# Patient Record
Sex: Male | Born: 1950 | Race: White | Hispanic: No | Marital: Married | Smoking: Never smoker
Health system: Southern US, Community
[De-identification: ages and names within clinical notes are randomized; demographics above are authoritative.]

## PROBLEM LIST (undated history)

## (undated) DIAGNOSIS — H60339 Swimmer's ear, unspecified ear: Secondary | ICD-10-CM

## (undated) DIAGNOSIS — E785 Hyperlipidemia, unspecified: Secondary | ICD-10-CM

## (undated) DIAGNOSIS — K900 Celiac disease: Secondary | ICD-10-CM

## (undated) DIAGNOSIS — K219 Gastro-esophageal reflux disease without esophagitis: Secondary | ICD-10-CM

## (undated) DIAGNOSIS — M545 Low back pain, unspecified: Secondary | ICD-10-CM

## (undated) DIAGNOSIS — I2510 Atherosclerotic heart disease of native coronary artery without angina pectoris: Secondary | ICD-10-CM

## (undated) HISTORY — DX: Swimmer's ear, unspecified ear: H60.339

## (undated) HISTORY — DX: Celiac disease: K90.0

## (undated) HISTORY — DX: Low back pain, unspecified: M54.50

## (undated) HISTORY — DX: Atherosclerotic heart disease of native coronary artery without angina pectoris: I25.10

## (undated) HISTORY — PX: UPPER GASTROINTESTINAL ENDOSCOPY: SHX188

## (undated) HISTORY — DX: Hyperlipidemia, unspecified: E78.5

## (undated) HISTORY — DX: Gastro-esophageal reflux disease without esophagitis: K21.9

## (undated) HISTORY — PX: COLONOSCOPY: SHX174

## (undated) HISTORY — DX: Low back pain: M54.5

---
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
## 1971-02-11 HISTORY — PX: APPENDECTOMY: SHX54

## 1983-02-11 HISTORY — PX: NASAL SEPTOPLASTY W/ TURBINOPLASTY: SHX2070

## 2004-01-16 ENCOUNTER — Ambulatory Visit: Payer: Self-pay | Admitting: Internal Medicine

## 2004-01-23 ENCOUNTER — Ambulatory Visit: Payer: Self-pay | Admitting: Internal Medicine

## 2004-02-29 ENCOUNTER — Ambulatory Visit: Payer: Self-pay | Admitting: Internal Medicine

## 2004-03-05 ENCOUNTER — Ambulatory Visit: Payer: Self-pay | Admitting: Gastroenterology

## 2004-03-07 ENCOUNTER — Ambulatory Visit: Payer: Self-pay | Admitting: Internal Medicine

## 2004-03-19 ENCOUNTER — Ambulatory Visit: Payer: Self-pay | Admitting: Gastroenterology

## 2004-08-30 ENCOUNTER — Ambulatory Visit: Payer: Self-pay | Admitting: Internal Medicine

## 2004-09-06 ENCOUNTER — Ambulatory Visit: Payer: Self-pay | Admitting: Internal Medicine

## 2005-03-03 ENCOUNTER — Ambulatory Visit: Payer: Self-pay | Admitting: Internal Medicine

## 2005-03-07 ENCOUNTER — Ambulatory Visit: Payer: Self-pay | Admitting: Internal Medicine

## 2006-12-24 DIAGNOSIS — G4700 Insomnia, unspecified: Secondary | ICD-10-CM

## 2006-12-24 DIAGNOSIS — K219 Gastro-esophageal reflux disease without esophagitis: Secondary | ICD-10-CM

## 2006-12-24 DIAGNOSIS — E785 Hyperlipidemia, unspecified: Secondary | ICD-10-CM

## 2007-01-05 ENCOUNTER — Telehealth: Payer: Self-pay | Admitting: Internal Medicine

## 2007-02-10 ENCOUNTER — Ambulatory Visit: Payer: Self-pay | Admitting: Internal Medicine

## 2007-02-10 LAB — CONVERTED CEMR LAB
ALT: 28 units/L (ref 0–53)
AST: 24 units/L (ref 0–37)
Albumin: 4.2 g/dL (ref 3.5–5.2)
Alkaline Phosphatase: 63 units/L (ref 39–117)
BUN: 15 mg/dL (ref 6–23)
Basophils Absolute: 0 10*3/uL (ref 0.0–0.1)
Basophils Relative: 0.3 % (ref 0.0–1.0)
Bilirubin Urine: NEGATIVE
Bilirubin, Direct: 0.2 mg/dL (ref 0.0–0.3)
Blood in Urine, dipstick: NEGATIVE
CO2: 30 meq/L (ref 19–32)
Calcium: 9.4 mg/dL (ref 8.4–10.5)
Chloride: 104 meq/L (ref 96–112)
Cholesterol: 186 mg/dL (ref 0–200)
Creatinine, Ser: 1 mg/dL (ref 0.4–1.5)
Eosinophils Absolute: 0.2 10*3/uL (ref 0.0–0.6)
Eosinophils Relative: 3.1 % (ref 0.0–5.0)
GFR calc Af Amer: 99 mL/min
GFR calc non Af Amer: 82 mL/min
Glucose, Bld: 93 mg/dL (ref 70–99)
Glucose, Urine, Semiquant: NEGATIVE
HCT: 43.3 % (ref 39.0–52.0)
HDL: 36.7 mg/dL — ABNORMAL LOW (ref >39.0)
Hemoglobin: 15.2 g/dL (ref 13.0–17.0)
Ketones, urine, test strip: NEGATIVE
LDL Cholesterol: 113 mg/dL — ABNORMAL HIGH (ref 0–99)
Lymphocytes Relative: 25 % (ref 12.0–46.0)
MCHC: 35.1 g/dL (ref 30.0–36.0)
MCV: 87 fL (ref 78.0–100.0)
Monocytes Absolute: 0.5 10*3/uL (ref 0.2–0.7)
Monocytes Relative: 7.5 % (ref 3.0–11.0)
Neutro Abs: 4.3 10*3/uL (ref 1.4–7.7)
Neutrophils Relative %: 64.1 % (ref 43.0–77.0)
Nitrite: NEGATIVE
PSA: 1.89 ng/mL (ref 0.10–4.00)
Platelets: 226 10*3/uL (ref 150–400)
Potassium: 4.4 meq/L (ref 3.5–5.1)
Protein, U semiquant: NEGATIVE
RBC: 4.98 M/uL (ref 4.22–5.81)
RDW: 13.3 % (ref 11.5–14.6)
Sodium: 141 meq/L (ref 135–145)
Specific Gravity, Urine: >=1.03
TSH: 3.91 microintl units/mL (ref 0.35–5.50)
Total Bilirubin: 0.9 mg/dL (ref 0.3–1.2)
Total CHOL/HDL Ratio: 5.1
Total Protein: 7.1 g/dL (ref 6.0–8.3)
Triglycerides: 182 mg/dL — ABNORMAL HIGH (ref 0–149)
Urobilinogen, UA: 0.2
VLDL: 36 mg/dL (ref 0–40)
WBC Urine, dipstick: NEGATIVE
WBC: 6.7 10*3/uL (ref 4.5–10.5)
pH: 5.5

## 2007-02-11 HISTORY — PX: CORONARY ARTERY BYPASS GRAFT: SHX141

## 2007-02-19 ENCOUNTER — Ambulatory Visit: Payer: Self-pay | Admitting: Internal Medicine

## 2007-02-22 ENCOUNTER — Telehealth: Payer: Self-pay | Admitting: Internal Medicine

## 2007-02-26 ENCOUNTER — Telehealth (INDEPENDENT_AMBULATORY_CARE_PROVIDER_SITE_OTHER): Payer: Self-pay | Admitting: *Deleted

## 2007-03-03 ENCOUNTER — Ambulatory Visit: Payer: Self-pay | Admitting: Internal Medicine

## 2007-03-08 ENCOUNTER — Telehealth (INDEPENDENT_AMBULATORY_CARE_PROVIDER_SITE_OTHER): Payer: Self-pay | Admitting: *Deleted

## 2007-03-09 ENCOUNTER — Telehealth (INDEPENDENT_AMBULATORY_CARE_PROVIDER_SITE_OTHER): Payer: Self-pay | Admitting: *Deleted

## 2007-03-10 ENCOUNTER — Ambulatory Visit: Payer: Self-pay | Admitting: Cardiology

## 2007-03-10 ENCOUNTER — Inpatient Hospital Stay (HOSPITAL_COMMUNITY): Admission: AD | Admit: 2007-03-10 | Discharge: 2007-03-16 | Payer: Self-pay | Admitting: Cardiology

## 2007-03-11 ENCOUNTER — Telehealth: Payer: Self-pay | Admitting: Internal Medicine

## 2007-03-11 ENCOUNTER — Encounter: Payer: Self-pay | Admitting: Thoracic Surgery (Cardiothoracic Vascular Surgery)

## 2007-03-11 ENCOUNTER — Ambulatory Visit: Payer: Self-pay | Admitting: Thoracic Surgery (Cardiothoracic Vascular Surgery)

## 2007-04-05 ENCOUNTER — Ambulatory Visit: Payer: Self-pay | Admitting: Thoracic Surgery (Cardiothoracic Vascular Surgery)

## 2007-04-05 ENCOUNTER — Ambulatory Visit: Payer: Self-pay | Admitting: Cardiology

## 2007-04-05 ENCOUNTER — Encounter
Admission: RE | Admit: 2007-04-05 | Discharge: 2007-04-05 | Payer: Self-pay | Admitting: Thoracic Surgery (Cardiothoracic Vascular Surgery)

## 2007-04-05 ENCOUNTER — Encounter: Payer: Self-pay | Admitting: Internal Medicine

## 2007-04-08 ENCOUNTER — Encounter (HOSPITAL_COMMUNITY): Admission: RE | Admit: 2007-04-08 | Discharge: 2007-07-07 | Payer: Self-pay | Admitting: Cardiology

## 2007-05-05 ENCOUNTER — Encounter
Admission: RE | Admit: 2007-05-05 | Discharge: 2007-05-05 | Payer: Self-pay | Admitting: Thoracic Surgery (Cardiothoracic Vascular Surgery)

## 2007-05-13 ENCOUNTER — Ambulatory Visit: Payer: Self-pay | Admitting: Cardiology

## 2007-05-13 LAB — CONVERTED CEMR LAB
BUN: 15 mg/dL (ref 6–23)
Calcium: 9.1 mg/dL (ref 8.4–10.5)
Creatinine, Ser: 1 mg/dL (ref 0.4–1.5)
GFR calc Af Amer: 99 mL/min
GFR calc non Af Amer: 82 mL/min
Potassium: 4.2 meq/L (ref 3.5–5.1)

## 2007-06-25 ENCOUNTER — Ambulatory Visit: Payer: Self-pay | Admitting: Cardiology

## 2007-07-02 ENCOUNTER — Ambulatory Visit: Payer: Self-pay | Admitting: Cardiology

## 2007-07-02 LAB — CONVERTED CEMR LAB
ALT: 25 units/L (ref 0–53)
AST: 27 units/L (ref 0–37)
Alkaline Phosphatase: 82 units/L (ref 39–117)
Bilirubin, Direct: 0.1 mg/dL (ref 0.0–0.3)
CO2: 23 meq/L (ref 19–32)
Chloride: 107 meq/L (ref 96–112)
Glucose, Bld: 87 mg/dL (ref 70–99)
HDL: 31.8 mg/dL — ABNORMAL LOW (ref >39.0)
Potassium: 4.5 meq/L (ref 3.5–5.1)
Sodium: 137 meq/L (ref 135–145)
Total Protein: 7.7 g/dL (ref 6.0–8.3)

## 2007-09-16 ENCOUNTER — Ambulatory Visit: Payer: Self-pay | Admitting: Cardiology

## 2007-09-16 LAB — CONVERTED CEMR LAB
AST: 25 units/L (ref 0–37)
Alkaline Phosphatase: 51 units/L (ref 39–117)
LDL Cholesterol: 56 mg/dL (ref 0–99)
Total Bilirubin: 0.8 mg/dL (ref 0.3–1.2)
Total CHOL/HDL Ratio: 3.9

## 2007-10-14 ENCOUNTER — Ambulatory Visit: Payer: Self-pay | Admitting: Internal Medicine

## 2008-01-13 ENCOUNTER — Ambulatory Visit: Payer: Self-pay | Admitting: Cardiology

## 2008-01-13 LAB — CONVERTED CEMR LAB
Bilirubin, Direct: 0.2 mg/dL (ref 0.0–0.3)
HDL: 35 mg/dL — ABNORMAL LOW (ref >39.0)
LDL Cholesterol: 33 mg/dL (ref 0–99)
Total Bilirubin: 0.9 mg/dL (ref 0.3–1.2)
Total Protein: 6.9 g/dL (ref 6.0–8.3)
VLDL: 8 mg/dL (ref 0–40)

## 2008-01-20 ENCOUNTER — Ambulatory Visit: Payer: Self-pay | Admitting: Cardiology

## 2008-01-20 DIAGNOSIS — I25810 Atherosclerosis of coronary artery bypass graft(s) without angina pectoris: Secondary | ICD-10-CM

## 2008-03-27 ENCOUNTER — Encounter (INDEPENDENT_AMBULATORY_CARE_PROVIDER_SITE_OTHER): Payer: Self-pay | Admitting: *Deleted

## 2008-03-27 ENCOUNTER — Ambulatory Visit: Payer: Self-pay | Admitting: Internal Medicine

## 2008-05-22 ENCOUNTER — Ambulatory Visit: Payer: Self-pay | Admitting: Cardiology

## 2008-05-22 LAB — CONVERTED CEMR LAB
AST: 34 units/L (ref 0–37)
Albumin: 4 g/dL (ref 3.5–5.2)
Alkaline Phosphatase: 55 units/L (ref 39–117)
Cholesterol: 90 mg/dL (ref 0–200)
HDL: 41.6 mg/dL (ref >39.00)
LDL Cholesterol: 40 mg/dL (ref 0–99)
Total CHOL/HDL Ratio: 2
Total Protein: 7.2 g/dL (ref 6.0–8.3)
Triglycerides: 41 mg/dL (ref 0.0–149.0)

## 2008-06-19 ENCOUNTER — Ambulatory Visit: Payer: Self-pay | Admitting: Cardiovascular Disease

## 2008-09-21 ENCOUNTER — Ambulatory Visit: Payer: Self-pay | Admitting: Cardiology

## 2008-09-22 LAB — CONVERTED CEMR LAB
AST: 27 units/L (ref 0–37)
Albumin: 3.9 g/dL (ref 3.5–5.2)
Total Bilirubin: 1 mg/dL (ref 0.3–1.2)

## 2008-10-04 ENCOUNTER — Encounter: Payer: Self-pay | Admitting: Internal Medicine

## 2008-10-05 ENCOUNTER — Ambulatory Visit: Payer: Self-pay | Admitting: Cardiovascular Disease

## 2008-10-05 ENCOUNTER — Telehealth: Payer: Self-pay | Admitting: Internal Medicine

## 2008-10-05 ENCOUNTER — Telehealth (INDEPENDENT_AMBULATORY_CARE_PROVIDER_SITE_OTHER): Payer: Self-pay | Admitting: Pharmacist

## 2008-11-06 ENCOUNTER — Encounter (INDEPENDENT_AMBULATORY_CARE_PROVIDER_SITE_OTHER): Payer: Self-pay | Admitting: *Deleted

## 2009-02-16 ENCOUNTER — Telehealth: Payer: Self-pay | Admitting: Cardiology

## 2009-02-20 ENCOUNTER — Encounter (INDEPENDENT_AMBULATORY_CARE_PROVIDER_SITE_OTHER): Payer: Self-pay | Admitting: *Deleted

## 2009-02-23 ENCOUNTER — Ambulatory Visit: Payer: Self-pay | Admitting: Internal Medicine

## 2009-02-27 ENCOUNTER — Ambulatory Visit: Payer: Self-pay | Admitting: Cardiology

## 2009-02-28 ENCOUNTER — Encounter (INDEPENDENT_AMBULATORY_CARE_PROVIDER_SITE_OTHER): Payer: Self-pay | Admitting: *Deleted

## 2009-02-28 LAB — CONVERTED CEMR LAB
ALT: 26 U/L
AST: 33 U/L
Albumin: 3.9 g/dL
Alkaline Phosphatase: 47 U/L
Bilirubin, Direct: 0.2 mg/dL
Cholesterol: 95 mg/dL
HDL: 48.3 mg/dL
LDL Cholesterol: 39 mg/dL
Total Bilirubin: 1 mg/dL
Total CHOL/HDL Ratio: 2
Total Protein: 6.6 g/dL
Triglycerides: 38 mg/dL
VLDL: 7.6 mg/dL

## 2009-03-05 ENCOUNTER — Ambulatory Visit: Payer: Self-pay | Admitting: Cardiology

## 2009-03-09 ENCOUNTER — Ambulatory Visit: Payer: Self-pay | Admitting: Internal Medicine

## 2009-03-15 ENCOUNTER — Encounter: Payer: Self-pay | Admitting: Internal Medicine

## 2009-04-11 ENCOUNTER — Ambulatory Visit: Payer: Self-pay | Admitting: Internal Medicine

## 2009-04-11 DIAGNOSIS — J069 Acute upper respiratory infection, unspecified: Secondary | ICD-10-CM | POA: Insufficient documentation

## 2009-07-24 ENCOUNTER — Ambulatory Visit: Payer: Self-pay | Admitting: Family Medicine

## 2009-07-24 DIAGNOSIS — H60339 Swimmer's ear, unspecified ear: Secondary | ICD-10-CM

## 2009-08-02 ENCOUNTER — Telehealth: Payer: Self-pay | Admitting: Family Medicine

## 2009-08-02 ENCOUNTER — Ambulatory Visit: Payer: Self-pay | Admitting: Family Medicine

## 2009-09-27 IMAGING — CR DG CHEST 1V PORT
1 series · 1 of 1 positions shown · non-contrast
Comparison: 03/10/07.

CLINICAL DATA: Post catheter.  
 PORTABLE CHEST ? 1 VIEW:

[AP]
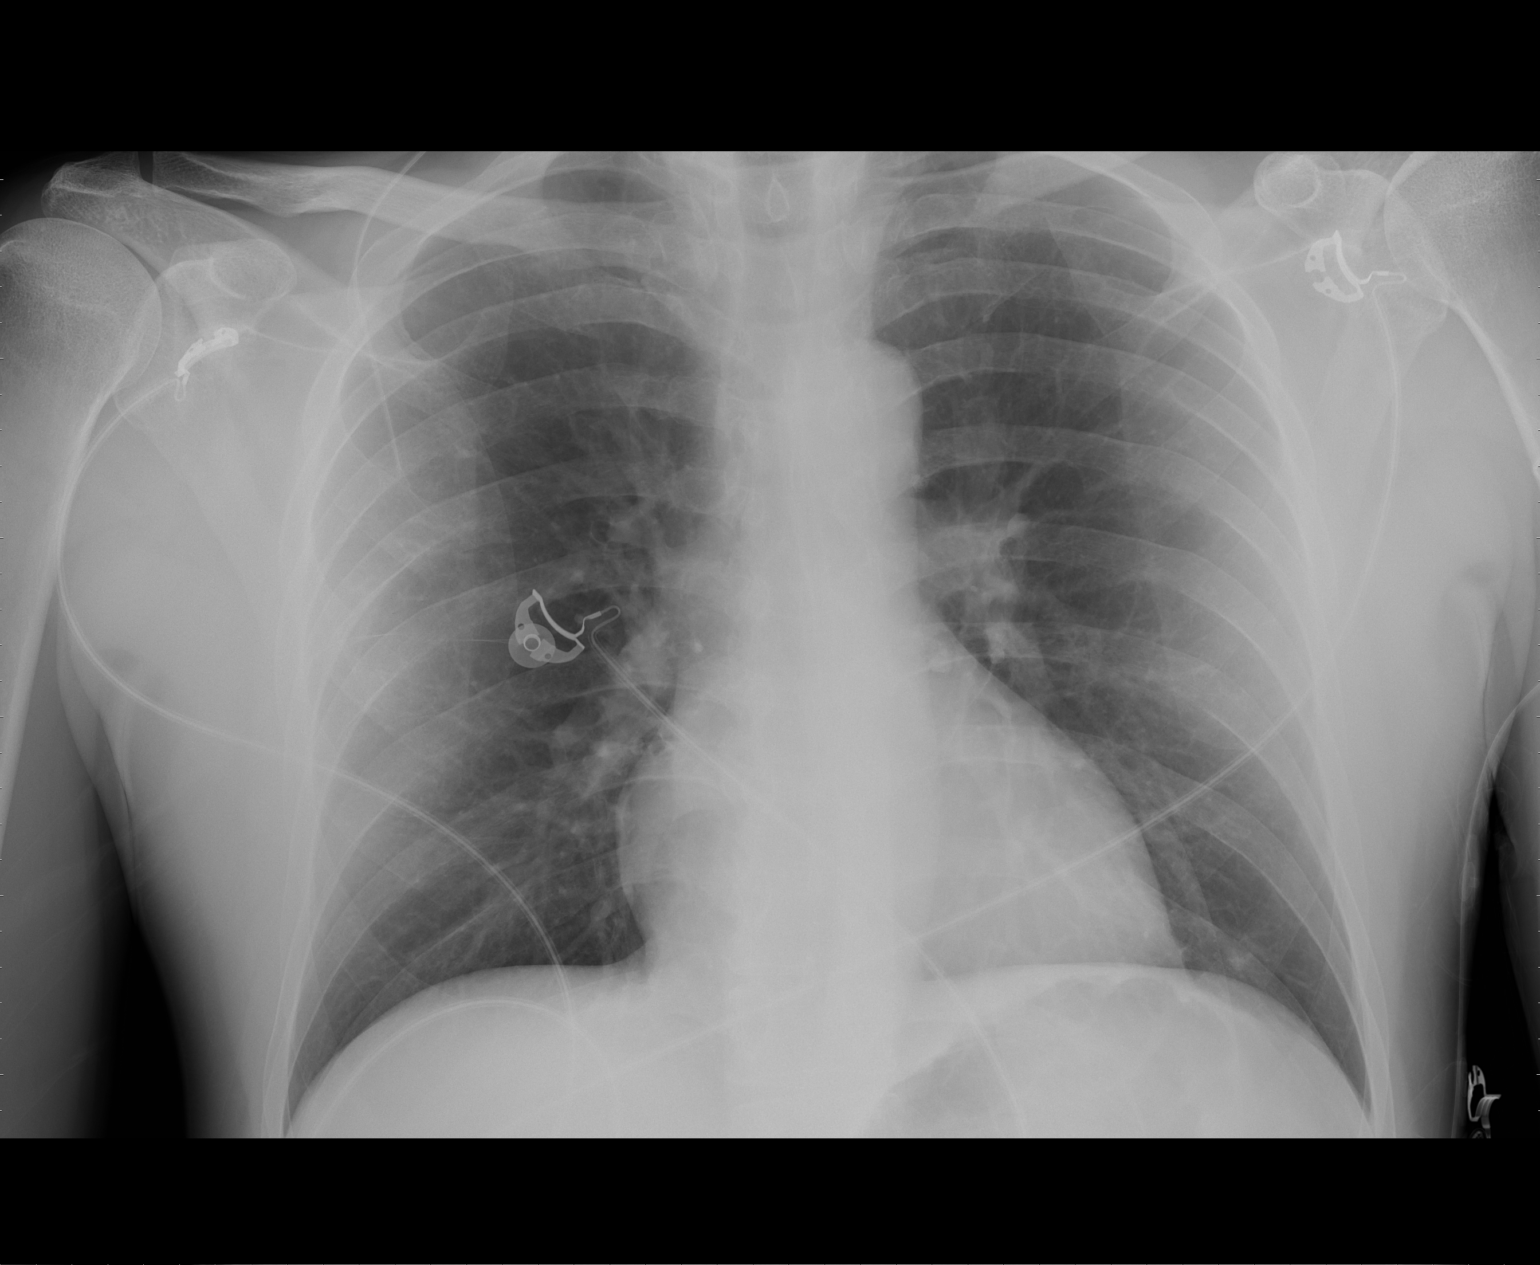

[1 of 1 positions shown; findings below may reference images not displayed]

FINDINGS: AP portable film at 8943 hours demonstrates cardiomegaly with clear lung fields.  No effusion, pneumothorax, or atelectasis and no change from prior.  Mild periapical bullous disease is suggested, possibility representing COPD.
IMPRESSION: No acute findings and no change from prior.

## 2009-10-22 IMAGING — CR DG CHEST 2V
2 series · 2 of 2 positions shown · non-contrast
Comparison: 03/15/07.

CLINICAL DATA: Status post CABG, follow up evaluation. 
TWO VIEW CHEST:

[w chest pa]
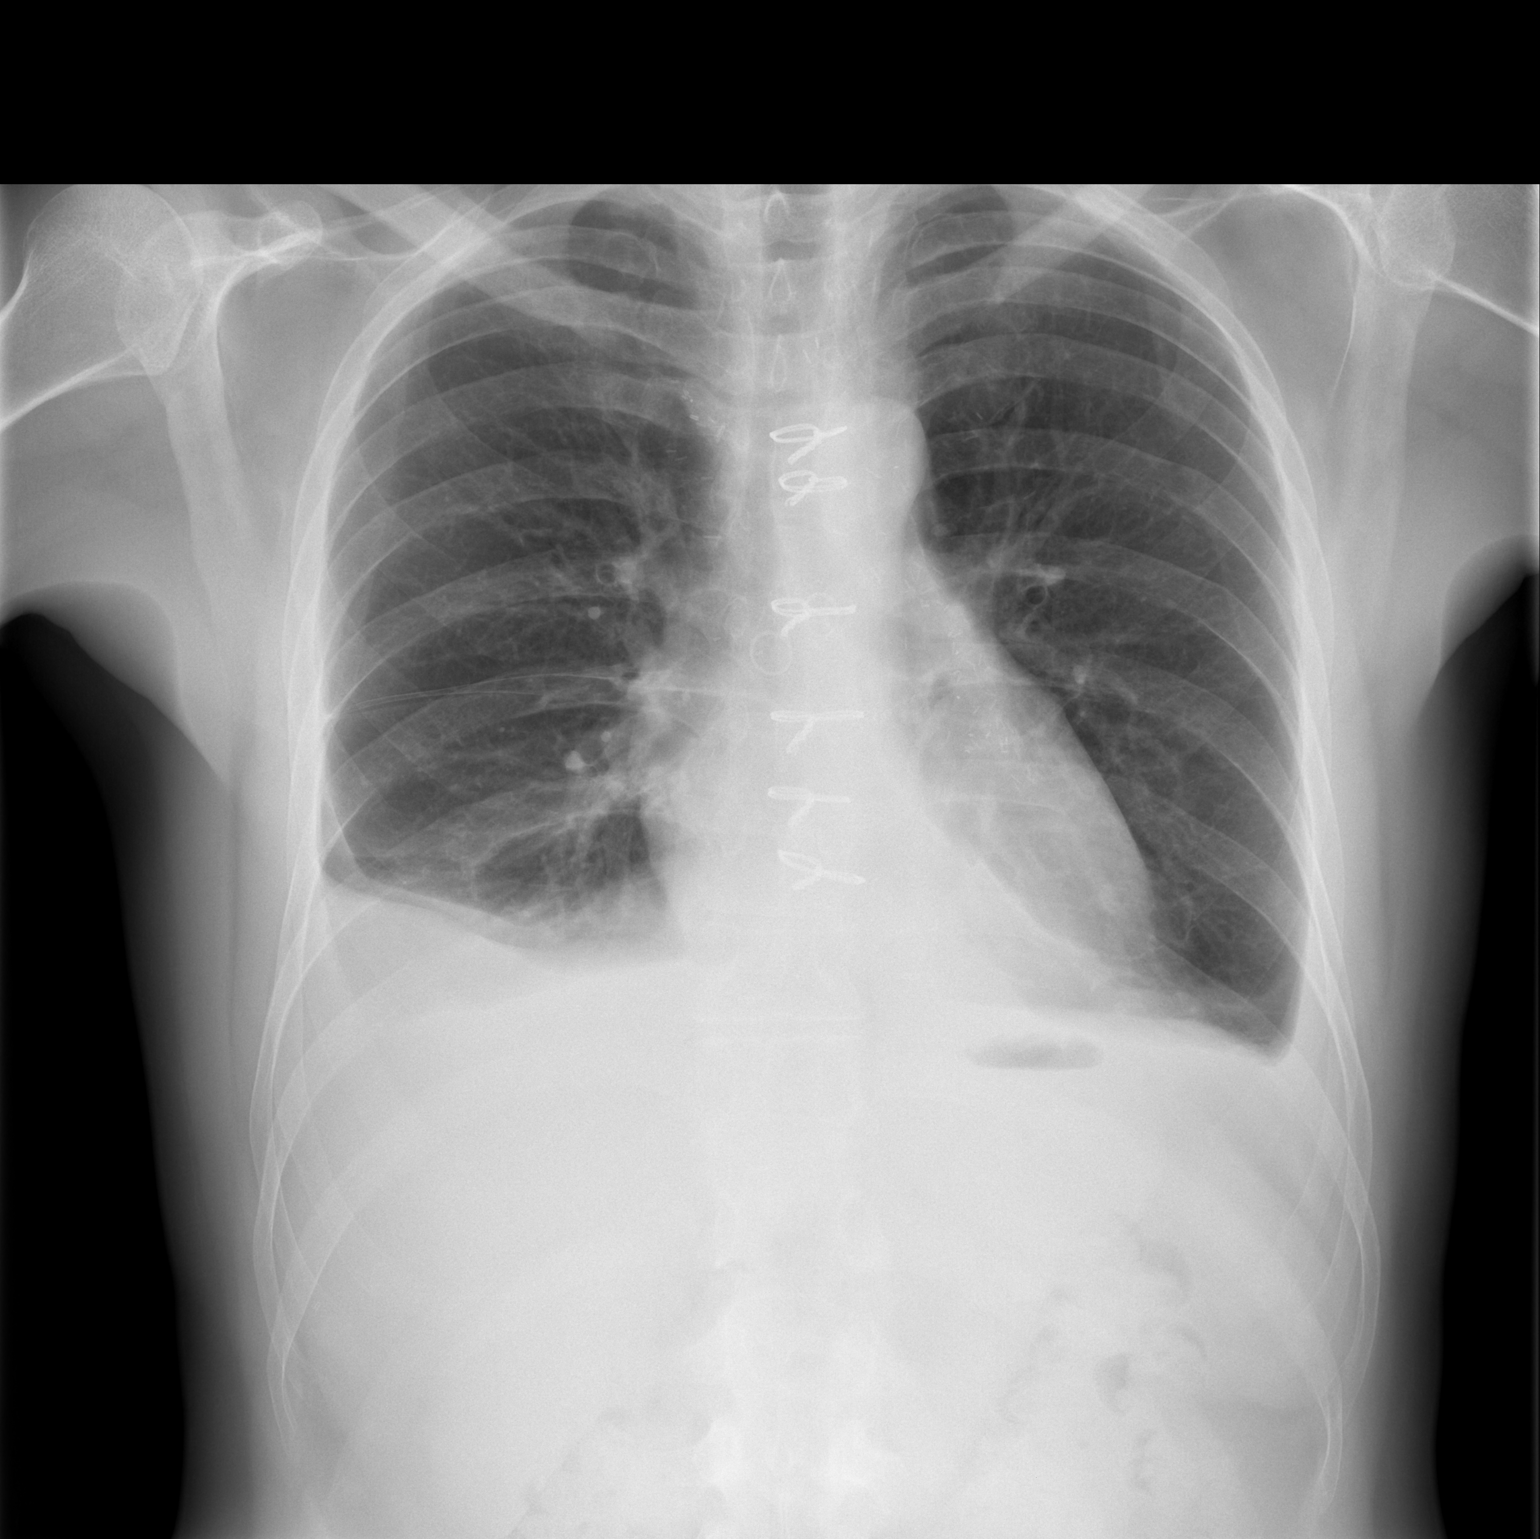

[w chest lat]
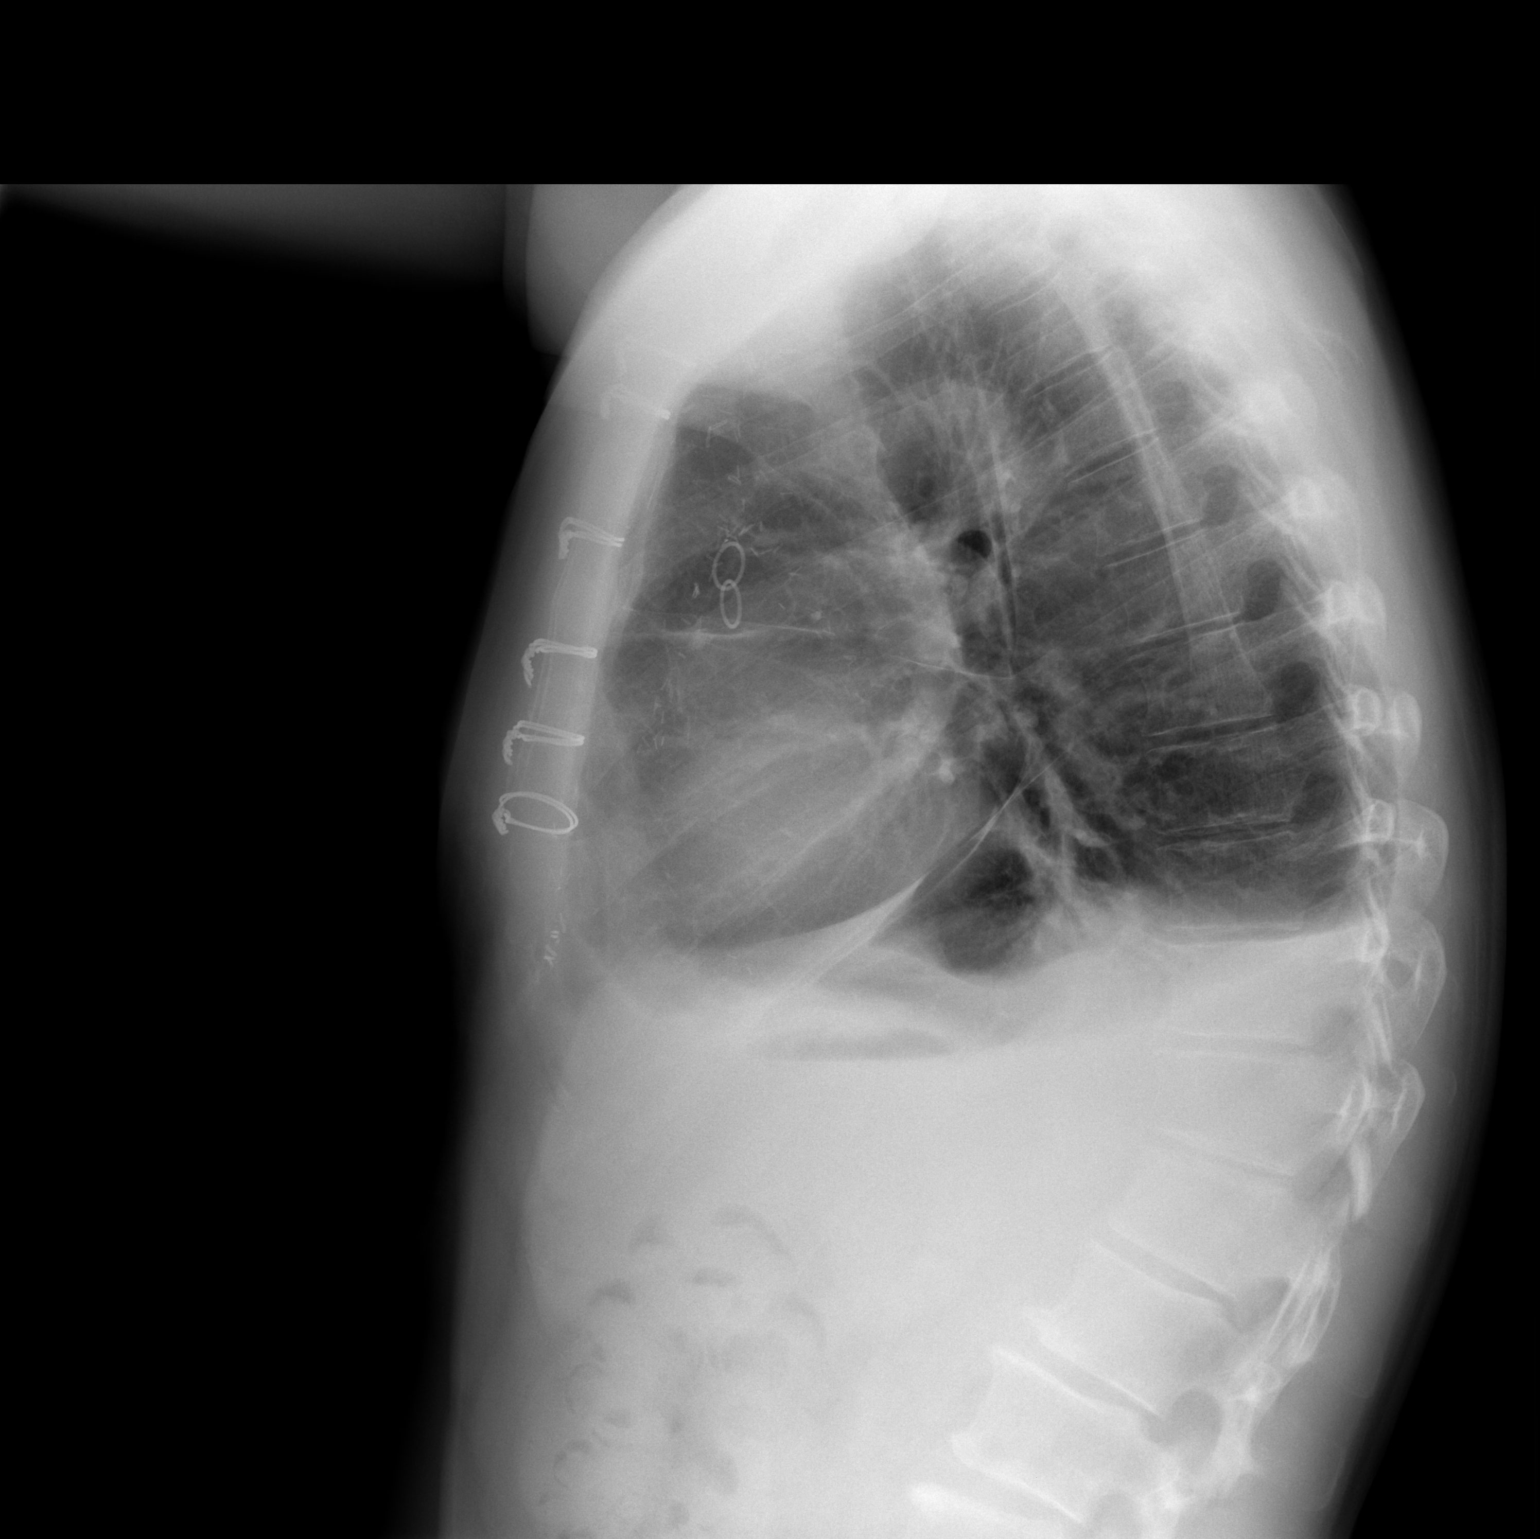

[2 of 2 positions shown; findings below may reference images not displayed]

FINDINGS: There has been interval improvement in bibasilar aeration with residual bibasilar atelectasis and small to moderate bilateral pleural effusions.  There is no pneumothorax.  There are no edematous changes.  The heart is normal in size.
IMPRESSION: Previous median sternotomy with improved bibasilar aeration.  There is residual bibasilar atelectasis and small bilateral pleural effusions remaining.  No pneumothorax.

## 2010-01-08 ENCOUNTER — Telehealth: Payer: Self-pay | Admitting: Cardiology

## 2010-02-20 ENCOUNTER — Telehealth: Payer: Self-pay | Admitting: Cardiology

## 2010-02-22 ENCOUNTER — Other Ambulatory Visit: Payer: Self-pay | Admitting: Cardiology

## 2010-02-22 ENCOUNTER — Ambulatory Visit
Admission: RE | Admit: 2010-02-22 | Discharge: 2010-02-22 | Payer: Self-pay | Source: Home / Self Care | Attending: Cardiology | Admitting: Cardiology

## 2010-02-22 LAB — LIPID PANEL
Cholesterol: 110 mg/dL (ref 0–200)
HDL: 49.9 mg/dL (ref >39.00)
LDL Cholesterol: 53 mg/dL (ref 0–99)
Total CHOL/HDL Ratio: 2
Triglycerides: 38 mg/dL (ref 0.0–149.0)
VLDL: 7.6 mg/dL (ref 0.0–40.0)

## 2010-02-22 LAB — FERRITIN: Ferritin: 18.4 ng/mL — ABNORMAL LOW (ref 22.0–322.0)

## 2010-02-22 LAB — BASIC METABOLIC PANEL
BUN: 15 mg/dL (ref 6–23)
CO2: 30 mEq/L (ref 19–32)
Calcium: 9.2 mg/dL (ref 8.4–10.5)
Chloride: 107 mEq/L (ref 96–112)
Creatinine, Ser: 0.9 mg/dL (ref 0.4–1.5)
GFR: 95.05 mL/min (ref >60.00)
Glucose, Bld: 73 mg/dL (ref 70–99)
Potassium: 4.7 mEq/L (ref 3.5–5.1)
Sodium: 142 mEq/L (ref 135–145)

## 2010-02-22 LAB — HEPATIC FUNCTION PANEL
ALT: 26 U/L (ref 0–53)
AST: 28 U/L (ref 0–37)
Albumin: 4.1 g/dL (ref 3.5–5.2)
Alkaline Phosphatase: 52 U/L (ref 39–117)
Bilirubin, Direct: 0.1 mg/dL (ref 0.0–0.3)
Total Bilirubin: 0.8 mg/dL (ref 0.3–1.2)
Total Protein: 6.6 g/dL (ref 6.0–8.3)

## 2010-02-26 ENCOUNTER — Encounter: Payer: Self-pay | Admitting: Cardiology

## 2010-02-26 ENCOUNTER — Ambulatory Visit
Admission: RE | Admit: 2010-02-26 | Discharge: 2010-02-26 | Payer: Self-pay | Source: Home / Self Care | Attending: Cardiology | Admitting: Cardiology

## 2010-02-26 ENCOUNTER — Other Ambulatory Visit: Payer: Self-pay | Admitting: Cardiology

## 2010-02-26 DIAGNOSIS — D509 Iron deficiency anemia, unspecified: Secondary | ICD-10-CM | POA: Insufficient documentation

## 2010-02-26 LAB — CBC WITH DIFFERENTIAL/PLATELET
Basophils Absolute: 0 10*3/uL (ref 0.0–0.1)
Basophils Relative: 0.7 % (ref 0.0–3.0)
Eosinophils Absolute: 0.1 10*3/uL (ref 0.0–0.7)
Eosinophils Relative: 2.7 % (ref 0.0–5.0)
HCT: 41.1 % (ref 39.0–52.0)
Hemoglobin: 14.3 g/dL (ref 13.0–17.0)
Lymphocytes Relative: 24.6 % (ref 12.0–46.0)
Lymphs Abs: 1.2 10*3/uL (ref 0.7–4.0)
MCHC: 34.9 g/dL (ref 30.0–36.0)
MCV: 93 fl (ref 78.0–100.0)
Monocytes Absolute: 0.4 10*3/uL (ref 0.1–1.0)
Monocytes Relative: 7.2 % (ref 3.0–12.0)
Neutro Abs: 3.3 10*3/uL (ref 1.4–7.7)
Neutrophils Relative %: 64.8 % (ref 43.0–77.0)
Platelets: 138 10*3/uL — ABNORMAL LOW (ref 150.0–400.0)
RBC: 4.42 Mil/uL (ref 4.22–5.81)
RDW: 14.2 % (ref 11.5–14.6)
WBC: 5 10*3/uL (ref 4.5–10.5)

## 2010-03-06 ENCOUNTER — Encounter: Payer: Self-pay | Admitting: Internal Medicine

## 2010-03-06 ENCOUNTER — Ambulatory Visit
Admission: RE | Admit: 2010-03-06 | Discharge: 2010-03-06 | Payer: Self-pay | Source: Home / Self Care | Attending: Internal Medicine | Admitting: Internal Medicine

## 2010-03-12 NOTE — Assessment & Plan Note (Signed)
Summary: ear ache/dm   Vital Signs:  Patient profile:   61 year old male Height:      69 inches (175.26 cm) Weight:      155 pounds (70.45 kg) O2 Sat:      99 % on Room air Temp:     98.2 degrees F (36.78 degrees C) oral Pulse rate:   92 / minute BP sitting:   130 / 78  (right arm) Cuff size:   regular  Vitals Entered By: Gardenia Phlegm RMA (July 24, 2009 2:09 PM)  O2 Flow:  Room air CC: Left ear ache X4 days- pt states when he lays down at night he feels a liquid coming out of ear, brownish color / pt states that over the last year he has weaned hisself to 106m twice a week on the Protonix/ CF Is Patient Diabetic? No   History of Present Illness: Patient with 4 day history or Left ache worsening over past 24 hours. He had trouble sleeping last night due to the pain and the pain is now radiating behind ear and into area just in front of ear as well. Ibuprofen provides some relief temporarily. Had a bad chill yesterday but no obvious fever/HA. Started out with a sore throat and mild ear pain about 4 days ago. Throat is now better but ear worse and some brownish discharge noted over past 24 hours, without odor. Mild nasal congestion and cough noted as well. Denies CP/palp/myalgias/wheeze but has noted some fatigue.  Current Medications (verified): 1)  Lipitor 20 Mg Tabs (Atorvastatin Calcium) .... Take 1 Tablet By Mouth At Bedtime 2)  Niacin Cr 250 Mg Cr-Caps (Niacin) .... 2 Tab By Mouth Two Times A Day 3)  Protonix 40 Mg Tbec (Pantoprazole Sodium) .... Take 1 Tablet By Mouth Once A Day 4)  Aspirin Ec 325 Mg Tbec (Aspirin) .... Once Daily 5)  Multivitamins   Tabs (Multiple Vitamin) .... Qd 6)  Fish Oil Maximum Strength 1200 Mg Caps (Omega-3 Fatty Acids) .... 2 Capsules Two Times A Day 7)  Lisinopril 5 Mg Tabs (Lisinopril) .... Take 1 Tablet By Mouth Once A Day 8)  Mega Multi Men  Cr-Tabs (Multiple Vitamins-Minerals) ..Marland Kitchen. 1 Tab By Mouth Once Daily  Allergies (verified): No Known Drug  Allergies  Past History:  Past medical history reviewed for relevance to current acute and chronic problems. Social history (including risk factors) reviewed for relevance to current acute and chronic problems.  Past Medical History: Reviewed history from 01/20/2008 and no changes required. GERD Hyperlipidemia insomnia CAD PMH-FH-SH reviewed for relevance  Social History: Reviewed history from 01/20/2008 and no changes required. Occupation: Married Never Smoked Regular exercise-yes Alcohol Use - yes (occasional)  Review of Systems      See HPI  Physical Exam  General:  Well-developed,well-nourished,in no acute distress; alert,appropriate and cooperative throughout examination Head:  Normocephalic and atraumatic without obvious abnormalities. No apparent alopecia or balding.   ENT Exam:  General:     In no acute distress.   Head:     Normocephalic and atraumatic.   Left Ear:     External: slight crusting yellowish brown noted at bottom of pinna.      Canal: edema, erythema, and purulence.       Tympanic Membrane: erythema and retracted.   Right Ear:     External: Pinna and periauricular area is normal.       Canal: Ear canal skin is not inflamed or edematous.  Tympanic Membrane: Intact.   Nose:     External: No deformity or lesions noted.       Mucosa: erythema,  boggy Pharynx:     Oral Cavity: Lips, gums, and teeth are unremarkable. Tongue is mobile.      Oropharynx: Pharynx without signs of inflammation, tonsils unremarkable, palate with bilateral rise.  Neck:     Lymph Nodes: enlarged Left posterior auricular LN     Thyroid: normal to palpation.  Lungs:     Symmetric chest wall rise, no labored breathing.   Heart:     Regular rate and rhythm, S1, S2 without murmurs, rubs, gallops, or clicks.   Abdomen:     Bowel sounds positive; abdomen soft and non-tender without masses, organomegaly, or hernias noted.   Extremities:     No clubbing, cyanosis,  edema, or deformity noted with normal full range of motion of all joints.   Psych:     Alert and cooperative; normal mood and affect; normal attention span and concentration.     Impression & Recommendations:  Problem # 1:  OTITIS EXTERNA, ACUTE, LEFT (ICD-380.12) with some lymphadenitis, mild noted posterior auricular region, started on Ciprofloxacin two times a day, use Aleve, Ultram and moist heat as needed for pain relief. Report worsening symptoms  Problem # 2:  INSOMNIA (ICD-780.52) Unable to sleep last night largely due to pain, encouraged to start antibiotics and try Tramadol tonight for pain  Complete Medication List: 1)  Lipitor 20 Mg Tabs (Atorvastatin calcium) .... Take 1 tablet by mouth at bedtime 2)  Niacin Cr 250 Mg Cr-caps (Niacin) .... 2 tab by mouth two times a day 3)  Protonix 40 Mg Tbec (Pantoprazole sodium) .... Take 1 tablet by mouth once a day 4)  Aspirin Ec 325 Mg Tbec (Aspirin) .... Once daily 5)  Multivitamins Tabs (Multiple vitamin) .... Qd 6)  Fish Oil Maximum Strength 1200 Mg Caps (Omega-3 fatty acids) .... 2 capsules two times a day 7)  Lisinopril 5 Mg Tabs (Lisinopril) .... Take 1 tablet by mouth once a day 8)  Mega Multi Men Cr-tabs (Multiple vitamins-minerals) .Marland Kitchen.. 1 tab by mouth once daily 9)  Cipro 500 Mg Tabs (Ciprofloxacin hcl) .Marland Kitchen.. 1 tab by mouth two times a day x 10 days 10)  Ultram 50 Mg Tabs (Tramadol hcl) .Marland Kitchen.. 1-2 tabs by mouth two times a day as needed pain  Patient Instructions: 1)  Patient to use Aleve 2 tabs two times a day as needed for pain. Given Tramadol to use at night if pain worsens. Start Ciprofloxacin today, take til gone and Align probiotic daily for next month. Avoid swimming and water in the ear for the next several days. May apply moist heat to neck below ear if pain worsens. 2)  Please schedule a follow-up appointment as needed .  3)  May take Tylenol [phone] mg every 4-6 hours ( no more then 4 times a day) If you see no  improvement in 5-7 days or if there is increasing pain, fever, or new symptoms, call our office.  4)  Take your antibiotic as prescribed until ALL of it is gone, but stop if you develop a rash or swelling and contact our office as soon as possible.  Prescriptions: ULTRAM 50 MG TABS (TRAMADOL HCL) 1-2 tabs by mouth two times a day as needed pain  #30 x 0   Entered and Authorized by:   Penni Homans MD   Signed by:   Penni Homans MD on  07/24/2009   Method used:   Electronically to        [facility]. [phone]* (retail)       [place].       [place], [place]  [address]       Ph: [phone]       Fax: [phone]   RxID:   [id number] CIPRO 500 MG TABS (CIPROFLOXACIN HCL) 1 tab by mouth two times a day x 10 days  #20 x 0   Entered and Authorized by:   Penni Homans MD   Signed by:   Penni Homans MD on 07/24/2009   Method used:   Electronically to        [facility]. [phone]* (retail)       [place].       [place], [place]  [address]       Ph: [phone]       Fax: [phone]   RxID:   [id number]

## 2010-03-12 NOTE — Letter (Signed)
Summary: [place], [place] [address] [place]   [place], [place] [address]   Phone: [phone]  Fax: [phone]     February 28, 2009 MRN: [id number]   [place], [place]  [address]   Dear Mr. Eagen,  We have reviewed your cholesterol results.  They are as follows:     Total Cholesterol:    95 (Desirable: less than 200)       HDL  Cholesterol:     48.30  (Desirable: greater than 40 for men and 50 for women)       LDL Cholesterol:       39  (Desirable: less than 100 for low risk and less than 70 for moderate to high risk)       Triglycerides:       38.0  (Desirable: less than 150)  Our recommendations include:These numbers look good. Continue on the same medicine. Liver function is normal. Take care, Dr. Lyda Kalata.    Call our office at the number listed above if you have any questions.  Lowering your LDL cholesterol is important, but it is only one of a large number of "risk factors" that may indicate that you are at risk for heart disease, stroke or other complications of hardening of the arteries.  Other risk factors include:   A.  Cigarette Smoking* B.  High Blood Pressure* C.  Obesity* D.   Low HDL Cholesterol (see yours above)* E.   Diabetes Mellitus (higher risk if your is uncontrolled) F.  Family history of premature heart disease G.  Previous history of stroke or cardiovascular disease    *These are risk factors YOU HAVE CONTROL OVER.  For more information, visit .  There is now evidence that lowering the TOTAL CHOLESTEROL AND LDL CHOLESTEROL can reduce the risk of heart disease.  The American Heart Association recommends the following guidelines for the treatment of elevated cholesterol:  1.  If there is now current heart disease and less than two risk factors, TOTAL CHOLESTEROL should be less than 200 and LDL CHOLESTEROL should be less than 100. 2.  If there is current heart disease or two  or more risk factors, TOTAL CHOLESTEROL should be less than 200 and LDL CHOLESTEROL should be less than 70.  A diet low in cholesterol, saturated fat, and calories is the cornerstone of treatment for elevated cholesterol.  Cessation of smoking and exercise are also important in the management of elevated cholesterol and preventing vascular disease.  Studies have shown that 30 to 60 minutes of physical activity most days can help lower blood pressure, lower cholesterol, and keep your weight at a healthy level.  Drug therapy is used when cholesterol levels do not respond to therapeutic lifestyle changes (smoking cessation, diet, and exercise) and remains unacceptably high.  If medication is started, it is important to have you levels checked periodically to evaluate the need for further treatment options.  Thank you,    [facility]

## 2010-03-12 NOTE — Progress Notes (Signed)
Summary: order for bloodwork?   Phone Note Call from Patient   Caller: Patient [phone] Reason for Call: Talk to Nurse Summary of Call: pt made appt for labs for janurary visit, wants to know if he needs bloodwork prior? Initial call taken by: Lorenda Hatchet,  January 08, 2010 3:57 PM  Follow-up for Phone Call        spoke with pt, he will have lipid/ liver/bmp Fredia Beets, RN  January 08, 2010 4:33 PM

## 2010-03-12 NOTE — Progress Notes (Signed)
Summary: ptneeds refill asap on 2days left   Phone Note Refill Request Call back at Home Phone [phone] Message from:  [facility] on [facility] [address]  Refills Requested: Medication #1:  LISINOPRIL 5 MG TABS Take 1 tablet by mouth once a day. Initial call taken by: Shelda Pal,  February 16, 2009 11:11 AM    Prescriptions: LISINOPRIL 5 MG TABS (LISINOPRIL) Take 1 tablet by mouth once a day  #30 x 6   Entered by:   Julaine Hua, CMA   Authorized by:   Colin Mulders, MD, [facility]   Signed by:   Julaine Hua, CMA on 02/16/2009   Method used:   Electronically to        [facility]. [phone]* (retail)       [address]       [place],   [address]       Ph: [phone]       Fax: [phone]   RxID:   [id number]

## 2010-03-12 NOTE — Progress Notes (Signed)
Summary: left ear still a problem, return OV?  Phone Note Call from Patient Call back at [facility] Phone [phone]   Caller: Patient Call For: Dr Charlett Blake Summary of Call: VM from pt reporting he took his last Cipro today (10 days) following OV for left ear pain and hearing problem. 1.)  pt still experiencing pain (not as bad) and still cannot hear 2.)  is that normal?  If yes, how much longer? 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

## 2010-03-12 NOTE — Assessment & Plan Note (Signed)
Summary: F1Y/JSS  Medications Added NIACIN CR 250 MG CR-CAPS (NIACIN) 2 tab by mouth two times a day PROTONIX 20 MG TBEC (PANTOPRAZOLE SODIUM) 1 tab by mouth 5 days a week MEGA MULTI MEN  CR-TABS (MULTIPLE VITAMINS-MINERALS) 1 tab by mouth once daily        History of Present Illness: The patient is a very pleasant gentleman who has a history of coronary artery disease status post coronary bypassing graft in January 2009. Note at that time, he had a LIMA to the LAD, free RIMA to the obtuse marginal, and sequential saphenous vein graft to the first and second diagonals, as well as saphenous vein graft to the PDA.  Note preoperative carotid Dopplers performed revealed no significant right or left internal carotid artery stenosis. Last LDL on February 27, 2009  was 39 and  liver functions were normal. Since I last saw in December of 2009 the patient denies any dyspnea on exertion, orthopnea, PND, pedal edema, palpitations, syncope or chest pain.   Current Medications (verified): 1)  Lipitor 20 Mg Tabs (Atorvastatin Calcium) .... Take 1 Tablet By Mouth At Bedtime 2)  Niacin Cr 250 Mg Cr-Caps (Niacin) .... 2 Tab By Mouth Two Times A Day 3)  Protonix 20 Mg Tbec (Pantoprazole Sodium) .Marland Kitchen.. 1 Tab By Mouth 5 Days A Week 4)  Aspirin Ec 325 Mg Tbec (Aspirin) .... Once Daily 5)  Multivitamins   Tabs (Multiple Vitamin) .... Qd 6)  Fish Oil Maximum Strength 1200 Mg Caps (Omega-3 Fatty Acids) .... 2 Capsules Two Times A Day 7)  Lisinopril 5 Mg Tabs (Lisinopril) .... Take 1 Tablet By Mouth Once A Day 8)  Mega Multi Men  Cr-Tabs (Multiple Vitamins-Minerals) .Marland Kitchen.. 1 Tab By Mouth Once Daily  Allergies: No Known Drug Allergies  Past History:  Past Medical History: Reviewed history from 01/20/2008 and no changes required. GERD Hyperlipidemia insomnia CAD  Past Surgical History: Reviewed history from 01/20/2008 and no changes required. CABG (03/12/07; LIMA to LAD, free RIMA to OM, sequential SVG to D1  and D2, SVG to PDA) Previous left shoulder surgery tonsillectomy appendectomy  Social History: Reviewed history from 01/20/2008 and no changes required. Occupation: Married Never Smoked Regular exercise-yes Alcohol Use - yes (occasional)  Review of Systems       Some pain in left arm and right ankle from recent fall but no fevers or chills, productive cough, hemoptysis, dysphasia, odynophagia, melena, hematochezia, dysuria, hematuria, rash, seizure activity, orthopnea, PND, pedal edema, claudication. Remaining systems are negative.   Vital Signs:  Patient profile:   60 year old male Height:      69 inches Weight:      169 pounds BMI:     25.05 Pulse rate:   84 / minute Resp:     14 per minute BP sitting:   114 / 75  (left arm)  Vitals Entered By: Burnett Kanaris (March 05, 2009 9:23 AM)  Physical Exam  General:  Well-developed well-nourished in no acute distress.  Skin is warm and dry.  HEENT is normal.  Neck is supple. No thyromegaly.  Chest is clear to auscultation with normal expansion.  Cardiovascular exam is regular rate and rhythm.  Abdominal exam nontender or distended. No masses palpated. Extremities show no edema. neuro grossly intact    EKG  Procedure date:  03/05/2009  Findings:      Sinus rhythm at a rate of 83. Axis normal. Incomplete right bundle branch block. First degree AV block. Left atrial enlargement.  Impression & Recommendations:  Problem # 1:  HYPERLIPIDEMIA (QNE-148.4) Continue present medications. Recent lipids outstanding. His updated medication list for this problem includes:    Lipitor 20 Mg Tabs (Atorvastatin calcium) .Marland Kitchen... Take 1 tablet by mouth at bedtime    Niacin Cr 250 Mg Cr-caps (Niacin) .Marland Kitchen... 2 tab by mouth two times a day  Problem # 2:  CAD, ARTERY BYPASS GRAFT (ICD-414.04) Continue aspirin,  ACE inhibitor and statin. No recent chest pain. Continue lifestyle modification. The following medications were removed from  the medication list:    Metoprolol Tartrate 25 Mg Tabs (Metoprolol tartrate) .Marland Kitchen... Take one tablet by mouth twice a day His updated medication list for this problem includes:    Aspirin Ec 325 Mg Tbec (Aspirin) ..... Once daily    Lisinopril 5 Mg Tabs (Lisinopril) .Marland Kitchen... Take 1 tablet by mouth once a day  Problem # 3:  GERD (ICD-530.81)  His updated medication list for this problem includes:    Protonix 20 Mg Tbec (Pantoprazole sodium) .Marland Kitchen... 1 tab by mouth 5 days a week  Problem # 4:  INSOMNIA (ICD-780.52)  Patient Instructions: 1)  Your physician recommends that you schedule a follow-up appointment in: [place]

## 2010-03-12 NOTE — Miscellaneous (Signed)
Summary: [facility]  Clinical Lists Changes  Medications: Added new medication of MOVIPREP 100 GM  SOLR (PEG-KCL-NACL-NASULF-NA ASC-C) As per prep instructions. - Signed Rx of MOVIPREP 100 GM  SOLR (PEG-KCL-NACL-NASULF-NA ASC-C) As per prep instructions.;  #1 x 0;  Signed;  Entered by: Ulice Dash RN;  Authorized by: Irene Shipper MD;  Method used: Electronically to [facility]. #[id number]*, [address] [place], [place], [place]  [address], Ph: [phone], Fax: [phone] Observations: Added new observation of NKA: T (02/23/2009 8:06)    Prescriptions: MOVIPREP 100 GM  SOLR (PEG-KCL-NACL-NASULF-NA ASC-C) As per prep instructions.  #1 x 0   Entered by:   Ulice Dash RN   Authorized by:   Irene Shipper MD   Signed by:   Ulice Dash RN on 02/23/2009   Method used:   Electronically to        [facility]. [phone]* (retail)       [address].       [place], [place]  [address]       Ph: [phone]       Fax: [phone]   RxID:   [id number]

## 2010-03-12 NOTE — Letter (Signed)
Summary: [facility] Instructions  [facility]  [place], [place] [address]   Phone: [phone]  Fax: [phone]       [name]    [birth year]    MRN: [id number]        Procedure Day Sudie Grumbling:  Ludwig Clarks. [birth year]     Arrival Time:  1:00pm     Procedure Time: 2:00pm     Location of Procedure:                    _X_  [place] (4th Floor)                        [place]   Starting 5 days prior to your procedure   Sun. 01/23  do not eat nuts, seeds, popcorn, corn, beans, peas,  salads, or any raw vegetables.  Do not take any fiber supplements (e.g. Metamucil, Citrucel, and Benefiber).  THE DAY BEFORE YOUR PROCEDURE         DATE:  01/27  DAY: Raynelle Dick.  1.  Drink clear liquids the entire day-NO SOLID FOOD  2.  Do not drink anything colored red or purple.  Avoid juices with pulp.  No orange juice.  3.  Drink at least 64 oz. (8 glasses) of fluid/clear liquids during the day to prevent dehydration and help the prep work efficiently.  CLEAR LIQUIDS INCLUDE: Water Jello Ice Popsicles Tea (sugar ok, no milk/cream) Powdered fruit flavored drinks Coffee (sugar ok, no milk/cream) Gatorade Juice: apple, white grape, white cranberry  Lemonade Clear bullion, consomm, broth Carbonated beverages (any kind) Strained chicken noodle soup Hard Candy                             4.  In the morning, mix first dose of MoviPrep solution:    Empty 1 Pouch A and 1 Pouch B into the disposable container    Add lukewarm drinking water to the top line of the container. Mix to dissolve    Refrigerate (mixed solution should be used within 24 hrs)  5.  Begin drinking the prep at 5:00 p.m. The MoviPrep container is divided by 4 marks.   Every 15 minutes drink the solution down to the next mark (approximately 8 oz) until the full liter is complete.   6.  Follow completed prep with 16 oz of clear liquid of your choice (Nothing red  or purple).  Continue to drink clear liquids until bedtime.  7.  Before going to bed, mix second dose of MoviPrep solution:    Empty 1 Pouch A and 1 Pouch B into the disposable container    Add lukewarm drinking water to the top line of the container. Mix to dissolve    Refrigerate  THE DAY OF YOUR PROCEDURE      DATE:  01/28  DAY:  Ludwig Clarks.  Beginning at  9:00 a.m. (5 hours before procedure):         1. Every 15 minutes, drink the solution down to the next mark (approx 8 oz) until the full liter is complete.  2. Follow completed prep with 16 oz. of clear liquid of your choice.    3. You may drink clear liquids until 12:00pm  (2 HOURS BEFORE PROCEDURE).   MEDICATION INSTRUCTIONS  Unless otherwise instructed, you should take regular prescription medications with a small sip of water  as early as possible the morning of your procedure.          OTHER INSTRUCTIONS  You will need a responsible adult at least 61 years of age to accompany you and drive you home.   This person must remain in the waiting room during your procedure.  Wear loose fitting clothing that is easily removed.  Leave jewelry and other valuables at home.  However, you may wish to bring a book to read or  an iPod/MP3 player to listen to music as you wait for your procedure to start.  Remove all body piercing jewelry and leave at home.  Total time from sign-in until discharge is approximately 2-3 hours.  You should go home directly after your procedure and rest.  You can resume normal activities the  day after your procedure.  The day of your procedure you should not:   Drive   Make legal decisions   Operate machinery   Drink alcohol   Return to work  You will receive specific instructions about eating, activities and medications before you leave.    The above instructions have been reviewed and explained to me by   Ulice Dash RN  February 23, 2009 8:30 AM     I fully understand and can  verbalize these instructions _____________________________ Date _________

## 2010-03-12 NOTE — Assessment & Plan Note (Signed)
Summary: ST/NJR   Vital Signs:  Patient profile:   60 year old male Temp:     98 degrees F oral Pulse rate:   80 / minute Pulse rhythm:   regular Resp:     12 per minute BP sitting:   118 / 72  (left arm) Cuff size:   regular  Vitals Entered By: Rica Records, RN (April 11, 2009 11:50 AM) CC: c/o sore throat x 6 days, cough since yesterday Is Patient Diabetic? No Comments pt takes protonix 5 of 7 days a week   CC:  c/o sore throat x 6 days and cough since yesterday.  History of Present Illness: ST for 6 days no fever or chills sxs have persisted without relief no other significant URI type sxs He has flown recently (? ill contacts)  All other systems reviewed and were negative   Preventive Screening-Counseling & Management  Alcohol-Tobacco     Alcohol drinks/day: <1     Alcohol type: wine     Smoking Status: never  Current Problems (verified): 1)  Cad, Artery Bypass Graft  (ICD-414.04) 2)  Insomnia  (ICD-780.52) 3)  Hyperlipidemia  (ICD-272.4) 4)  Gerd  (ICD-530.81)  Current Medications (verified): 1)  Lipitor 20 Mg Tabs (Atorvastatin Calcium) .... Take 1 Tablet By Mouth At Bedtime 2)  Niacin Cr 250 Mg Cr-Caps (Niacin) .... 2 Tab By Mouth Two Times A Day 3)  Protonix 40 Mg Tbec (Pantoprazole Sodium) .... Take 1 Tablet By Mouth Once A Day 4)  Aspirin Ec 325 Mg Tbec (Aspirin) .... Once Daily 5)  Multivitamins   Tabs (Multiple Vitamin) .... Qd 6)  Fish Oil Maximum Strength 1200 Mg Caps (Omega-3 Fatty Acids) .... 2 Capsules Two Times A Day 7)  Lisinopril 5 Mg Tabs (Lisinopril) .... Take 1 Tablet By Mouth Once A Day 8)  Mega Multi Men  Cr-Tabs (Multiple Vitamins-Minerals) .Marland Kitchen.. 1 Tab By Mouth Once Daily  Allergies (verified): No Known Drug Allergies  Past History:  Past Medical History: Last updated: 01/20/2008 GERD Hyperlipidemia insomnia CAD  Past Surgical History: Last updated: 01/20/2008 CABG (03/12/07; LIMA to LAD, free RIMA to OM, sequential SVG to D1  and D2, SVG to PDA) Previous left shoulder surgery tonsillectomy appendectomy  Family History: Last updated: 02/19/2007 colon polyps coronary disease-father-CABG age 43 (smoker) father parkinsons/alzheimers-died age41 years old Family History Hypertension-parents mother MI-age 52  Social History: Last updated: 01/20/2008 Occupation: Married Never Smoked Regular exercise-yes Alcohol Use - yes (occasional)  Risk Factors: Alcohol Use: <1 (04/11/2009) Exercise: yes (02/19/2007)  Risk Factors: Smoking Status: never (04/11/2009)  Physical Exam  General:  alert and well-developed.   Eyes:  pupils equal and pupils round.   Mouth:  no gingival abnormalities and pharynx pink and moist.   Lungs:  Normal respiratory effort, chest expands symmetrically. Lungs are clear to auscultation, no crackles or wheezes.   Impression & Recommendations:  Problem # 1:  URI (ICD-465.9) no evidence of bacterial infection. call for any concerns, increased sxs, fever, persistence of sxs, wheeze, SOB.   His updated medication list for this problem includes:    Aspirin Ec 325 Mg Tbec (Aspirin) ..... Once daily   strep negative  Complete Medication List: 1)  Lipitor 20 Mg Tabs (Atorvastatin calcium) .... Take 1 tablet by mouth at bedtime 2)  Niacin Cr 250 Mg Cr-caps (Niacin) .... 2 tab by mouth two times a day 3)  Protonix 40 Mg Tbec (Pantoprazole sodium) .... Take 1 tablet by mouth once a day  4)  Aspirin Ec 325 Mg Tbec (Aspirin) .... Once daily 5)  Multivitamins Tabs (Multiple vitamin) .... Qd 6)  Fish Oil Maximum Strength 1200 Mg Caps (Omega-3 fatty acids) .... 2 capsules two times a day 7)  Lisinopril 5 Mg Tabs (Lisinopril) .... Take 1 tablet by mouth once a day 8)  Mega Multi Men Cr-tabs (Multiple vitamins-minerals) .Marland Kitchen.. 1 tab by mouth once daily  Other Orders: Rapid Strep (38182) Prescriptions: PROTONIX 40 MG TBEC (PANTOPRAZOLE SODIUM) Take 1 tablet by mouth once a day  #30 x 5    Entered by:   Rica Records, RN   Authorized by:   Phoebe Sharps MD   Signed by:   Rica Records, RN on 04/12/2009   Method used:   Electronically to        [facility]. [id number]* (retail)       [address]       [place], [place]  [address]       Ph: [phone]       Fax: [phone]   RxID:   [id number]

## 2010-03-12 NOTE — Assessment & Plan Note (Signed)
Summary: Follow up for ear pain/cb   Vital Signs:  Patient profile:   60 year old male Weight:      159 pounds (72.27 kg) O2 Sat:      91 % on Room air Temp:     98.0 degrees F (36.67 degrees C) oral Pulse rate:   95 / minute BP sitting:   120 / 80  (left arm) Cuff size:   regular  Vitals Entered By: Clearnce Sorrel CMA (August 02, 2009 11:58 AM)  O2 Flow:  Room air CC: FU can't hear in left ear with shooting painsnot consitant / src   History of Present Illness: Patient in to have left ear reevaluated. His discharge and significant pain are much better. He has tolerated the Cipro without any GI or other troubles. He never experienced any f/c/malaise. He has no congestion/cough/CP/SOB. His concern is over the past 1-2 days he has experienced some intermittent, fleeting pains in left ear. They last only seconds, are electrical and sharp and have occured every hour or so. Do not keep him up. Mild tinnitus on left noted. No other neurologic c/o. No HA or visual changes. No symptoms in right ear.  Current Medications (verified): 1)  Lipitor 20 Mg Tabs (Atorvastatin Calcium) .... Take 1 Tablet By Mouth At Bedtime 2)  Niacin Cr 250 Mg Cr-Caps (Niacin) .... 2 Tab By Mouth Two Times A Day 3)  Protonix 20 Mg Tbec (Pantoprazole Sodium) .... Take 1 Tablet By Mouth Once A Week 4)  Aspirin Ec 325 Mg Tbec (Aspirin) .... Once Daily 5)  Multivitamins   Tabs (Multiple Vitamin) .... Qd 6)  Fish Oil Maximum Strength 1200 Mg Caps (Omega-3 Fatty Acids) .... 2 Capsules Two Times A Day 7)  Lisinopril 5 Mg Tabs (Lisinopril) .... Take 1 Tablet By Mouth Once A Day 8)  Mega Multi Men  Cr-Tabs (Multiple Vitamins-Minerals) .Marland Kitchen.. 1 Tab By Mouth Once Daily 9)  Cipro 500 Mg Tabs (Ciprofloxacin Hcl) .Marland Kitchen.. 1 Tab By Mouth Two Times A Day X 10 Days 10)  Ultram 50 Mg Tabs (Tramadol Hcl) .Marland Kitchen.. 1-2 Tabs By Mouth Two Times A Day As Needed Pain  Allergies (verified): No Known Drug Allergies  Past History:  Past medical  history reviewed for relevance to current acute and chronic problems. Social history (including risk factors) reviewed for relevance to current acute and chronic problems.  Past Medical History: Reviewed history from 01/20/2008 and no changes required. GERD Hyperlipidemia insomnia CAD  Social History: Reviewed history from 01/20/2008 and no changes required. Occupation: Married Never Smoked Regular exercise-yes Alcohol Use - yes (occasional)  Review of Systems      See HPI  Physical Exam  General:  Well-developed,well-nourished,in no acute distress; alert,appropriate and cooperative throughout examination Head:  Normocephalic and atraumatic without obvious abnormalities. Ears:  R ear normal.  Left external canal cleared, no edema, erythema or discharge. L TM edematous and dull. No erythema Mouth:  Oral mucosa normal, MMM Neck:  No deformities, masses, or tenderness noted. Cervical Nodes:  No lymphadenopathy noted Psych:  Cognition and judgment appear intact. Alert and cooperative with normal attention span and concentration. No apparent delusions, illusions, hallucinations   Impression & Recommendations:  Problem # 1:  OTITIS EXTERNA, ACUTE, LEFT (ICD-380.12) Assessment Improved Greatly improved but now able to see TM which is notably dull and mildly edematous. Use Aleve two times a day and should note that hearing gradually improves over next 3-4 weeks, if no improvement will need ENT referral.  Complete Medication List: 1)  Lipitor 20 Mg Tabs (Atorvastatin calcium) .... Take 1 tablet by mouth at bedtime 2)  Niacin Cr 250 Mg Cr-caps (Niacin) .... 2 tab by mouth two times a day 3)  Protonix 20 Mg Tbec (pantoprazole Sodium)  .... Take 1 tablet by mouth once a week 4)  Aspirin Ec 325 Mg Tbec (Aspirin) .... Once daily 5)  Multivitamins Tabs (Multiple vitamin) .... Qd 6)  Fish Oil Maximum Strength 1200 Mg Caps (Omega-3 fatty acids) .... 2 capsules two times a day 7)   Lisinopril 5 Mg Tabs (Lisinopril) .... Take 1 tablet by mouth once a day 8)  Mega Multi Men Cr-tabs (Multiple vitamins-minerals) .Marland Kitchen.. 1 tab by mouth once daily 9)  Cipro 500 Mg Tabs (Ciprofloxacin hcl) .Marland Kitchen.. 1 tab by mouth two times a day x 10 days 10)  Ultram 50 Mg Tabs (Tramadol hcl) .Marland Kitchen.. 1-2 tabs by mouth two times a day as needed pain   Patient Instructions: 1)  Please schedule a follow-up appointment as needed if symptoms worsen again or do not continue to improve. Use Aleve two times a day for next 5 days then as needed.  Prevention & Chronic Care Immunizations   Influenza vaccine: Not documented    Tetanus booster: 02/19/2007: Tdap    Pneumococcal vaccine: Not documented  Colorectal Screening   Hemoccult: Not documented    Colonoscopy: DONE  (03/09/2009)   Colonoscopy due: 02/2014  Other Screening   PSA: 1.89  (02/10/2007)   Smoking status: never  (04/11/2009)  Lipids   Total Cholesterol: 95  (02/27/2009)   LDL: 39  (02/27/2009)   LDL Direct: Not documented   HDL: 48.30  (02/27/2009)   Triglycerides: 38.0  (02/27/2009)    SGOT (AST): 33  (02/27/2009)   SGPT (ALT): 26  (02/27/2009)   Alkaline phosphatase: 47  (02/27/2009)   Total bilirubin: 1.0  (02/27/2009)  Self-Management Support :    Lipid self-management support: Not documented

## 2010-03-12 NOTE — Procedures (Signed)
Summary: Colonoscopy  Patient: [name] Note: All result statuses are Final unless otherwise noted.  Tests: (1) Colonoscopy (COL)   COL Colonoscopy           [facility].     [place], [place]  [address]           COLONOSCOPY PROCEDURE REPORT           PATIENT:  [name], [name]  MR#:  [id number]     BIRTHDATE:  11-22-1950, 10 yrs. old  GENDER:  male           ENDOSCOPIST:  Docia Chuck. Geri Seminole, MD     Referred by:  Surveillance Program Recall,           PROCEDURE DATE:  03/09/2009     PROCEDURE:  Colonoscopy with snare polypectomy X 3     ASA CLASS:  Class III     INDICATIONS:  history of pre-cancerous (adenomatous) colon polyps     (last exam 03-2004 w/ adenoma)           MEDICATIONS:   Fentanyl 100 mcg IV, Versed 9 mg IV           DESCRIPTION OF PROCEDURE:   After the risks benefits and     alternatives of the procedure were thoroughly explained, informed     consent was obtained.  Digital rectal exam was performed and     revealed no abnormalities.   The LB [id number] endoscope     was introduced through the anus and advanced to the cecum, which     was identified by both the appendix and ileocecal valve, without     limitations. TIME TO CECUM = 5:46 MIN.The quality of the prep was     excellent, using MoviPrep.  The instrument was then slowly     withdrawn (TIME = 15:27 MIN) as the colon was fully examined.     <<PROCEDUREIMAGES>>           FINDINGS:  Three polyps were found: 67m cecal and 398m3mm  in the     ascending colon. Polyps were snared without cautery. Retrieval was     successful. This was otherwise a normal examination of the colon.     Retroflexed views in the rectum revealed no abnormalities.    The     scope was then withdrawn from the patient and the procedure     completed.           COMPLICATIONS:  None           ENDOSCOPIC IMPRESSION:     1) Three polyps - removed     2) Otherwise normal examination  RECOMMENDATIONS:     1) Follow up colonoscopy in 5 years           ______________________________     JoDocia ChuckPeGeri SeminoleMD           CC:  BrLisabeth PickMD;The Patient           n.     eSLorrin MaisDocia ChuckPeGeri Seminolet 03/09/2009 03:24 PM           KyJolyn [id number]: An exclamation mark (!) indicates a result that was not dispersed into the flowsheet. Document Creation Date: 03/09/2009 3:24 PM _______________________________________________________________________  (1) Order result status: Final Collection or observation date-time: 03/09/2009 15:18 Requested date-time:  Receipt  date-time:  Reported date-time:  Referring Physician:   Ordering Physician: Lavena Bullion [phone]) Specimen Source:  Source: Tawanna Cooler Order Number: [id number] Lab site:   Appended Document: Colonoscopy recall     Procedures Next Due Date:    Colonoscopy: 02/2014

## 2010-03-12 NOTE — Letter (Signed)
Summary: Patient Notice- Polyp Results  [facility]  [address] [place], [place] [address]   Phone: [phone]  Fax: [phone]        March 15, 2009 MRN: [id number]    [place], [place]  [address]    Dear [name],  I am pleased to inform you that the colon polyp(s) removed during your recent colonoscopy was (were) found to be benign (no cancer detected) upon pathologic examination.  I recommend you have a repeat colonoscopy examination in 5 years to look for recurrent polyps, as having colon polyps increases your risk for having recurrent polyps or even colon cancer in the future.  Should you develop new or worsening symptoms of abdominal pain, bowel habit changes or bleeding from the rectum or bowels, please schedule an evaluation with either your primary care physician or with me.  Additional information/recommendations:  __ No further action with gastroenterology is needed at this time. Please      follow-up with your primary care physician for your other healthcare      needs.    Please call us if you are having persistent problems or have questions about your condition that have not been fully answered at this time.  Sincerely,  Rodney Shipper MD  This letter has been electronically signed by your physician.  Appended Document: Patient Notice- Polyp Results Letter mailed 2.10.11

## 2010-03-13 LAB — CONVERTED CEMR LAB: Tissue Transglutaminase Ab, IgA: 160 units — ABNORMAL HIGH (ref <20)

## 2010-03-14 ENCOUNTER — Other Ambulatory Visit (INDEPENDENT_AMBULATORY_CARE_PROVIDER_SITE_OTHER): Payer: Self-pay

## 2010-03-14 DIAGNOSIS — K921 Melena: Secondary | ICD-10-CM

## 2010-03-14 DIAGNOSIS — IMO0001 Reserved for inherently not codable concepts without codable children: Secondary | ICD-10-CM

## 2010-03-14 LAB — HEMOCCULT GUIAC POC 1CARD (OFFICE): Fecal Occult Blood: NEGATIVE

## 2010-03-14 NOTE — Progress Notes (Signed)
Summary: pt has questions regarding bloodwork    Phone Note Call from Patient Call back at Home Phone [phone]   Caller: Patient Reason for Call: Talk to Nurse, Talk to Doctor Summary of Call: pt has question about bloodwork that he is to get done friday Initial call taken by: Shelda Pal,  February 20, 2010 12:10 PM  Follow-up for Phone Call        Phone Call Completed PT [place] .PER PT HAS FAMILY HX OF HEMOCHROMATOSIS. Follow-up by: Devra Dopp, LPN,  February 20, 299 1:09 PM

## 2010-03-14 NOTE — Assessment & Plan Note (Signed)
Summary: F1Y/DM    CC:  no complaints.  History of Present Illness: The patient is a very pleasant gentleman who has a history of coronary artery disease status post coronary bypassing graft in January 2009. Note at that time, he had a LIMA to the LAD, free RIMA to the obtuse marginal, and sequential saphenous vein graft to the first and second diagonals, as well as saphenous vein graft to the PDA.  Note preoperative carotid Dopplers performed revealed no significant right or left internal carotid artery stenosis. Recent laboratories in January of 2012 showed an LDL of 53, normal liver functions and a low ferritin. Since I last saw in Jan 2011 the patient denies any dyspnea on exertion, orthopnea, PND, pedal edema, palpitations, syncope or chest pain.  Current Medications (verified): 1)  Lipitor 20 Mg Tabs (Atorvastatin Calcium) .... Take 1 Tablet By Mouth At Bedtime 2)  Niacin Cr 250 Mg Cr-Caps (Niacin) .... 2 Tab By Mouth Two Times A Day 3)  Aspirin Ec 325 Mg Tbec (Aspirin) .... Once Daily 4)  Multivitamins   Tabs (Multiple Vitamin) .... Qd 5)  Fish Oil Maximum Strength 1200 Mg Caps (Omega-3 Fatty Acids) .... 2 Capsules Two Times A Day 6)  Lisinopril 5 Mg Tabs (Lisinopril) .... Take 1 Tablet By Mouth Once A Day 7)  Mega Multi Men  Cr-Tabs (Multiple Vitamins-Minerals) .Marland Kitchen.. 1 Tab By Mouth Once Daily  Allergies: No Known Drug Allergies  Past History:  Past Medical History: Reviewed history from 01/20/2008 and no changes required. GERD Hyperlipidemia insomnia CAD  Past Surgical History: Reviewed history from 01/20/2008 and no changes required. CABG (03/12/07; LIMA to LAD, free RIMA to OM, sequential SVG to D1 and D2, SVG to PDA) Previous left shoulder surgery tonsillectomy appendectomy  Social History: Reviewed history from 01/20/2008 and no changes required. Occupation: Married Never Smoked Regular exercise-yes Alcohol Use - yes (occasional)  Review of Systems       no  fevers or chills, productive cough, hemoptysis, dysphasia, odynophagia, melena, hematochezia, dysuria, hematuria, rash, seizure activity, orthopnea, PND, pedal edema, claudication. Remaining systems are negative.   Vital Signs:  Patient profile:   60 year old male Height:      69 inches Weight:      157 pounds BMI:     23.27 Pulse rate:   74 / minute Resp:     14 per minute BP sitting:   115 / 70  (left arm)  Vitals Entered By: Burnett Kanaris (February 26, 2010 8:40 AM)  Physical Exam  General:  Well-developed well-nourished in no acute distress.  Skin is warm and dry.  HEENT is normal.  Neck is supple. No thyromegaly.  Chest is clear to auscultation with normal expansion.  Cardiovascular exam is regular rate and rhythm.  Abdominal exam nontender or distended. No masses palpated. Extremities show no edema. neuro grossly intact    EKG  Procedure date:  02/26/2010  Findings:      sinus rhythm at a rate of 74. Axis normal. Incomplete right bundle branch block.  Impression & Recommendations:  Problem # 1:  CAD, ARTERY BYPASS GRAFT (ICD-414.04) Continue aspirin, ACE inhibitor and statin. Continue risk factor modification. His updated medication list for this problem includes:    Aspirin Ec 325 Mg Tbec (Aspirin) ..... Once daily    Lisinopril 5 Mg Tabs (Lisinopril) .Marland Kitchen... Take 1 tablet by mouth once a day  Problem # 2:  HYPERLIPIDEMIA (ICD-272.4)  Continue statin and niacin. His updated medication list for this  problem includes:    Lipitor 20 Mg Tabs (Atorvastatin calcium) .Marland Kitchen... Take 1 tablet by mouth at bedtime    Niacin Cr 250 Mg Cr-caps (Niacin) .Marland Kitchen... 2 tab by mouth two times a day  His updated medication list for this problem includes:    Lipitor 20 Mg Tabs (Atorvastatin calcium) .Marland Kitchen... Take 1 tablet by mouth at bedtime    Niacin Cr 250 Mg Cr-caps (Niacin) .Marland Kitchen... 2 tab by mouth two times a day  Problem # 3:  IRON DEFICIENCY (ICD-280.9)  Recent ferritin low.  Probable iron deficiency. He will need follow-up with his primary care physician concerning this issue and a possible GI evaluation. Check CBC.  Orders: TLB-CBC Platelet - w/Differential (85025-CBCD)  Patient Instructions: 1)  Your physician wants you to follow-up in: one year   You will receive a reminder letter in the mail two months in advance. If you don't receive a letter, please call our office to schedule the follow-up appointment.

## 2010-03-20 NOTE — Assessment & Plan Note (Signed)
Summary: REVIEW LABWORK // [facility] [place] APPT TIME/NJR   Vital Signs:  Patient profile:   60 year old male Weight:      159 pounds Temp:     97.5 degrees F oral Pulse rate:   76 / minute Pulse rhythm:   regular BP sitting:   118 / 76  (left arm) Cuff size:   regular  Vitals Entered By: Kathlene November, CMA (March 06, 2010 1:13 PM) CC: review labs from Dr. Ebony Cargo   CC:  review labs from Dr. Ebony Cargo.  History of Present Illness: pt concerned with recent labs he feels gret and has no complaints in a complete ROS low ferritin pt denies any abdominal pain recent colonoscopy reviewed from 2011   Current Medications (verified): 1)  Lipitor 20 Mg Tabs (Atorvastatin Calcium) .... Take 1 Tablet By Mouth At Bedtime 2)  Niacin Cr 250 Mg Cr-Caps (Niacin) .... 2 Tab By Mouth Two Times A Day 3)  Aspirin Ec 325 Mg Tbec (Aspirin) .... Once Daily 4)  Multivitamins   Tabs (Multiple Vitamin) .... Qd 5)  Fish Oil Maximum Strength 1200 Mg Caps (Omega-3 Fatty Acids) .... 2 Capsules Two Times A Day 6)  Lisinopril 5 Mg Tabs (Lisinopril) .... Take 1 Tablet By Mouth Once A Day 7)  Mega Multi Men  Cr-Tabs (Multiple Vitamins-Minerals) .Marland Kitchen.. 1 Tab By Mouth Once Daily  Allergies (verified): No Known Drug Allergies  Family History: colon polyps coronary disease-father-CABG age 64 (smoker) father parkinsons/alzheimers-died age68 years old Family History Hypertension-parents mother MI-age 29 brother with hemochromatosis  Social History: Reviewed history from 01/20/2008 and no changes required. Occupation: Married Never Smoked Regular exercise-yes Alcohol Use - yes (occasional)  Physical Exam  General:  alert and well-developed.   Neck:  No deformities, masses, or tenderness noted.   Impression & Recommendations:  Problem # 1:  IRON DEFICIENCY (ICD-280.9) based on ferritin given recent GI evaluation will start with stool cards note he does donate blood but none in at leaset  9 months Orders: Venipuncture (33582) T-Sprue Panel (Celiac Disease Aby Eval) (83516x3/[address]) Hemoccult Cards (Take Home) (Hemoccult Cards) Specimen Handling (99000)  Complete Medication List: 1)  Lipitor 20 Mg Tabs (Atorvastatin calcium) .... Take 1 tablet by mouth at bedtime 2)  Niacin Cr 250 Mg Cr-caps (Niacin) .... 2 tab by mouth two times a day 3)  Aspirin Ec 325 Mg Tbec (Aspirin) .... Once daily 4)  Multivitamins Tabs (Multiple vitamin) .... Qd 5)  Fish Oil Maximum Strength 1200 Mg Caps (Omega-3 fatty acids) .... 2 capsules two times a day 6)  Lisinopril 5 Mg Tabs (Lisinopril) .... Take 1 tablet by mouth once a day 7)  Mega Multi Men Cr-tabs (Multiple vitamins-minerals) .Marland Kitchen.. 1 tab by mouth once daily   Orders Added: 1)  Venipuncture [51898] 2)  T-Sprue Panel (Celiac Disease Aby Eval) [42103X2/[address]] 3)  Hemoccult Cards (Take Home) [Hemoccult Cards] 4)  Specimen Handling [99000] 5)  Est. Patient Level III [73668]

## 2010-04-22 ENCOUNTER — Telehealth: Payer: Self-pay | Admitting: Internal Medicine

## 2010-04-22 NOTE — Telephone Encounter (Signed)
Pt called and said that Dr Leanne Chang wated pt to come in to see how new diet and iron supplement was doing. Pt is wondering if he needs to sch labs and then sch a fup ov? Pls advise.

## 2010-04-24 NOTE — Telephone Encounter (Signed)
Schedule fasting labs 1 week prior to OV

## 2010-04-26 NOTE — Telephone Encounter (Signed)
Pt has been sch for fasting cpx labs on 05/13/10 and fup appt on 05/31/10, as noted.

## 2010-05-13 ENCOUNTER — Other Ambulatory Visit: Payer: Self-pay

## 2010-05-27 ENCOUNTER — Other Ambulatory Visit: Payer: Self-pay | Admitting: Internal Medicine

## 2010-05-27 ENCOUNTER — Other Ambulatory Visit (INDEPENDENT_AMBULATORY_CARE_PROVIDER_SITE_OTHER): Payer: 59

## 2010-05-27 ENCOUNTER — Other Ambulatory Visit: Payer: 59

## 2010-05-27 DIAGNOSIS — K901 Tropical sprue: Secondary | ICD-10-CM

## 2010-05-27 DIAGNOSIS — Z0389 Encounter for observation for other suspected diseases and conditions ruled out: Secondary | ICD-10-CM

## 2010-05-27 DIAGNOSIS — Z0000 Encounter for general adult medical examination without abnormal findings: Secondary | ICD-10-CM

## 2010-05-27 LAB — HEPATIC FUNCTION PANEL
Albumin: 3.9 g/dL (ref 3.5–5.2)
Alkaline Phosphatase: 49 U/L (ref 39–117)
Total Protein: 6.3 g/dL (ref 6.0–8.3)

## 2010-05-27 LAB — CBC WITH DIFFERENTIAL/PLATELET
Basophils Absolute: 0 10*3/uL (ref 0.0–0.1)
Eosinophils Absolute: 0.2 10*3/uL (ref 0.0–0.7)
Hemoglobin: 14.3 g/dL (ref 13.0–17.0)
Lymphocytes Relative: 25.8 % (ref 12.0–46.0)
Lymphs Abs: 1.5 10*3/uL (ref 0.7–4.0)
MCHC: 33.8 g/dL (ref 30.0–36.0)
Monocytes Relative: 7.3 % (ref 3.0–12.0)
Neutro Abs: 3.6 10*3/uL (ref 1.4–7.7)
Platelets: 131 10*3/uL — ABNORMAL LOW (ref 150.0–400.0)
RDW: 14 % (ref 11.5–14.6)

## 2010-05-27 LAB — PSA: PSA: 3.66 ng/mL (ref 0.10–4.00)

## 2010-05-27 LAB — URINALYSIS, ROUTINE W REFLEX MICROSCOPIC
Hgb urine dipstick: NEGATIVE
Nitrite: NEGATIVE
Urobilinogen, UA: 0.2 (ref 0.0–1.0)

## 2010-05-27 LAB — BASIC METABOLIC PANEL
BUN: 16 mg/dL (ref 6–23)
CO2: 29 mEq/L (ref 19–32)
Calcium: 8.7 mg/dL (ref 8.4–10.5)
GFR: 103.13 mL/min (ref >60.00)
Glucose, Bld: 76 mg/dL (ref 70–99)
Sodium: 140 mEq/L (ref 135–145)

## 2010-05-27 LAB — LIPID PANEL
Cholesterol: 108 mg/dL (ref 0–200)
HDL: 54.9 mg/dL (ref >39.00)
Triglycerides: 37 mg/dL (ref 0.0–149.0)
VLDL: 7.4 mg/dL (ref 0.0–40.0)

## 2010-05-27 LAB — TSH: TSH: 3.34 u[IU]/mL (ref 0.35–5.50)

## 2010-05-27 LAB — FERRITIN: Ferritin: 44.1 ng/mL (ref 22.0–322.0)

## 2010-05-30 ENCOUNTER — Encounter: Payer: Self-pay | Admitting: Internal Medicine

## 2010-05-31 ENCOUNTER — Encounter: Payer: Self-pay | Admitting: Internal Medicine

## 2010-05-31 ENCOUNTER — Ambulatory Visit (INDEPENDENT_AMBULATORY_CARE_PROVIDER_SITE_OTHER): Payer: 59 | Admitting: Internal Medicine

## 2010-05-31 DIAGNOSIS — K900 Celiac disease: Secondary | ICD-10-CM

## 2010-05-31 NOTE — Assessment & Plan Note (Signed)
Has no sxs Ferritin improved He is remarkably compliant with diet  I'll see him in 9 months

## 2010-05-31 NOTE — Progress Notes (Signed)
  Subjective:    Patient ID: [name], male    DOB: 1950-11-26, 60 y.o.   MRN: [id number]  HPI  Sprue: feels great No complaints  Past Medical History  Diagnosis Date  . GERD (gastroesophageal reflux disease)   . Hyperlipidemia   . Insomnia   . CAD (coronary artery disease)    Past Surgical History  Procedure Date  . Coronary artery bypass graft   . Appendectomy   . Tonsilectomy, adenoidectomy, bilateral myringotomy and tubes   . Shoulder surgery     left    reports that he has never smoked. He does not have any smokeless tobacco history on file. He reports that he drinks alcohol. His drug history not on file. family history includes Dementia in his father; Heart attack in his mother; Heart disease in his father; Hemochromatosis in his brother; Hypertension in his father and mother; and Parkinsonism in his father. No Known Allergies   Past Medical History  Diagnosis Date  . GERD (gastroesophageal reflux disease)   . Hyperlipidemia   . Insomnia   . CAD (coronary artery disease)    Past Surgical History  Procedure Date  . Coronary artery bypass graft   . Appendectomy   . Tonsilectomy, adenoidectomy, bilateral myringotomy and tubes   . Shoulder surgery     left    reports that he has never smoked. He does not have any smokeless tobacco history on file. He reports that he drinks alcohol. His drug history not on file. family history includes Dementia in his father; Heart attack in his mother; Heart disease in his father; Hemochromatosis in his brother; Hypertension in his father and mother; and Parkinsonism in his father. No Known Allergies   Review of Systems  patient denies chest pain, shortness of breath, orthopnea. Denies lower extremity edema, abdominal pain, change in appetite, change in bowel movements. Patient denies rashes, musculoskeletal complaints. No other specific complaints in a complete review of systems.      Objective:   Physical Exam Appears  well       Assessment & Plan:

## 2010-06-25 NOTE — Assessment & Plan Note (Signed)
[facility]                               LIPID CLINIC NOTE   ZAYLON, BOSSIER                         MRN:          [id number]  DATE:10/14/2007                            DOB:          [birth year]    First office visit for [facility], referred to [place] by his cardiologist,  Dr. Stanford Breed.   PAST MEDICAL HISTORY:  1. Hyperlipidemia.  2. Coronary artery disease status post coronary artery bypass graft      x5.   MEDICATIONS:  1. Aspirin 325 mg daily.  2. Lipitor 20 mg daily.  3. Protonix 40 mg daily.  4. Metoprolol 25 mg twice daily.  5. Fish oil 1 g twice daily.  6. Niacin just recently increased to 1000 mg twice daily.   LABORATORY DATA:  Total cholesterol 123, triglyceride 175, HDL 32, LDL  56.  LFTs within normal limits.   PHYSICAL EXAMINATION:  Vital signs, weight 168 pounds, blood pressure  120/80, heart rate 70.   ASSESSMENT:  Mr. Palazzi is a very pleasant 60 year old gentleman who comes  to [facility] today referred by his cardiologist, Dr. Stanford Breed for  treatment of his hyperlipidemia.  Specifically, he has a history of a  familial hyperlipidemia.  His father as well as three uncles have had  coronary artery disease and hyperlipidemia.  He has been very  conscientious of his risk for the last 20 years.  He has been very  adamant about 4 days a week exercise routine for a minimum of 45 minutes  to an hour and been very conscientious of a low-fat, low-carbohydrate  diet.  He does very little snacking except whenever he gets very busy at  work, which has been the last 3 months.  He says that he has increased  amount of cheese and crackers that he has been eating at work.  He does  a lot of snacking when he reads and works a lot and because of his  increased stress at work and increased hours, he has not exercised as he  would like to and knows that he should.  He says that he has maybe  gotten 1-2 days a week and 30-45 minutes of  exercise either walking on  the treadmill or riding his bike.  He is very aware of the risks of  cardiovascular disease and with hyperlipidemia.  He is also very in tune  with trying to keep his lipid panel at goal and will implement lifestyle  changes specifically decrease the amount of cheese and other snacking  that he has been doing and most importantly re-instituting his exercise  regimen of walking or riding a bike for 45 minutes to an hour of 3-4  days a week.  He seems to be very committed to this lifestyle  modification.  He says that he eats very healthy at home.  His wife  cooks all their meals and is very conscientious of the need for low fat,  low carbohydrate and height vegetables in their diet.  He says that he  may make some poor choices  at lunch time when he is out, but for the  most part, tries to make healthy choices as well.  His total cholesterol  is at goal of less than 200, triglycerides are greater than goal of less  than 150; however, he does admit to eating large amount of carbohydrates  specifically cheese pizza prior to getting his labs drawn.  His LDL is  at goal of less than 70 and HDL less than goal of greater than 40.   PLAN:  1. Continue current medication regimen, but understanding that we may      make changes as time goes on to increase his HDL and bring down his      triglycerides.  2. Increase treadmill to 3-4 times a week for 45 minutes to an hour      and decrease cheese and other snacks that he had previously been      having.  3. Follow up visit in 3-4 months for lipid panel and LFTs and make      adjustments at that time.      Bonnita Nasuti, PharmD  Electronically Signed      Marijo Conception. Verl Blalock, MD, [facility]  Electronically Signed   LC/MedQ  DD: 10/14/2007  DT: 10/15/2007  Job #: [id number]

## 2010-06-25 NOTE — Assessment & Plan Note (Signed)
[facility]                               LIPID CLINIC NOTE   [name], [name]                         MRN:          [id number]  DATE:01/20/2008                            DOB:          22-Sep-1950    PAST MEDICAL HISTORY:  Hyperlipidemia, coronary artery disease status  post coronary artery bypass graft x5, and positive family history for  coronary artery disease.   MEDICATIONS:  1. Aspirin 325 mg daily.  2. Protonix 40 mg daily.  3. Lipitor 20 mg daily.  4. Niacin 1000 mg twice daily.  5. Fish oil 3.5 g daily.  6. Metoprolol 25 mg twice daily.  7. Lisinopril 5 mg daily.   LABORATORY DATA:  Total cholesterol 76, triglyceride 41, HDL 35, LDL 33.  LFTs within normal limits.   VITAL SIGNS:  Weight 170 pounds, blood pressure 112/68, heart rate was  70.   ASSESSMENT:  [name] is a very pleasant 60 year old gentleman who  returns to [facility] with no chest pain, no shortness of breath, no  muscle aches and pains.  He is compliant with current medication  regimen.  He is actually maybe over compliant with lifestyle  modification.  Since our last visit, he has completely cut out most of  fats from his diet.  He is not eating any cheese which he was previously  eating.  He has stopped eating red meat except maybe once since his last  visit.  He eats high amounts of fish, fruits, and vegetables, and very  low amount of carbohydrates.  He has self increased his fish oil and his  niacin since last visit.  I have encouraged him not to go up higher in  his niacin, although he would like to help more quickly increase his  HDL.  He also has increased his exercise and been very compliant with 4-  5 days of exercise for a minimum of 45 minutes.  He had previously been  slacking on his exercise and his diet.  He now has a total cholesterol  of less than 200, triglyceride goal of less than 150, HDL less than goal  of greater than 40; however, significantly  increased from 32-35 in 3  months and an LDL from 56-33.  I have encouraged him to continue his  exercise regimen.  I have encouraged him to add some healthy fats back  into his diet that we do need some small amount of healthy fats to  continue our metabolic functions on a daily basis versus being 99% fat-  free diet per him.  I wonder about the accuracy of these labs that they  really make this significant of a change in 3 short months or could  there be some type of lab err variants, we will follow up with the  repeat in 4 months to ensure that his LDL did not go less than 30.   PLAN:  1. To continue lifestyle modification with the reintroduction of some      healthy fats.  We discussed that during his office visit today.  2. Continue exercise regimen.  3. Continue current medication regimen with the possibility of backing      down his Lipitor to 10      mg daily if LDL stays in the 30 range.  4. Followup visit for lipid panel and LFTs in 4 months and make      changes at that time.      Bonnita Nasuti, PharmD  Electronically Signed      Marijo Conception. Verl Blalock, MD, [facility]  Electronically Signed   LC/MedQ  DD: 01/20/2008  DT: 01/20/2008  Job #: [id number]

## 2010-06-25 NOTE — Consult Note (Signed)
NAMEAVIAN, KONIGSBERG NO.:  [id number]   MEDICAL RECORD NO.:  [id number]          PATIENT TYPE:  INP   LOCATION:  2807                         FACILITY:  [facility]   PHYSICIAN:  Revonda Standard. Roxan Hockey, M.D.DATE OF BIRTH:  November 11, 1950   DATE OF CONSULTATION:  03/11/2007  DATE OF DISCHARGE:                                 CONSULTATION   REASON FOR CONSULTATION:  Three-vessel coronary disease with crescendo  angina.   HISTORY OF PRESENT ILLNESS:  Mr. Paulson is a 60 year old gentleman  admitted yesterday with unstable angina.  He has a strong family history  and also has a history of dyslipidemia.  He had been in his usual state  of good health until about 2 weeks ago, when he started noting chest  pain with exertion.  He had frequently used a recumbent bicycle and had  no problems with that.  For Christmas, he got a treadmill and with  ambulating on the treadmill, he noted shortness of breath.  He described  this as a pressure sensation that radiated to the upper extremities.  There was no nausea, vomiting, or shortness of breath, or diaphoresis.  He saw [name] and had a treadmill test.  He had chest pain on the  treadmill but no electrocardiographic changes coincided.  On Sunday,  four days prior to admission, he had an episode while climbing out of  the tub which lasted approximately 10 minutes.  The day prior to  admission, he was carrying a bag of leaves to the curb when he noted  particularly severe episode of the discomfort which again was rather  prolonged but finally relieved with rest.  He saw Dr. Stanford Breed yesterday  and was admitted with unstable angina.  He had an episode after  admission with 18 seconds of asystole in association with having an IV  placed.  This was felt to be likely a vagal episode.   Today, he underwent cardiac catheterization which revealed severe 3-  vessel disease.  He had particularly severe disease in his LAD with an  80-90% long  segment proximal ostial stenosis, 70% just prior to the  takeoff of the second diagonal, and an 80% in the LAD beyond the takeoff  of the second diagonal.  He had a large obtuse marginal which had a 70-  80% in the circumflex.  He also had 40-50% stenosis in his distal right  coronary.  Ejection fraction was 50-55% with some anterior hypokinesis.  The patient currently is pain free.   PAST MEDICAL HISTORY:  1. Hyperlipidemia.  2. Indigestion, possible gastroesophageal reflux.  3. Tonsillectomy.  4. Appendectomy.  5. Left shoulder surgery.   MEDICATIONS:  On admission:  1. Protonix 40 mg daily.  2. Lipitor 10 mg daily.  3. Aspirin 81 mg daily.  4. Niaspan 1 gram p.o. daily.   He has no known drug allergies.   FAMILY HISTORY:  Significant for coronary disease.  His father had  coronary bypass surgery at approximately the same age.  He has had four  uncles with coronary disease.  His mother had coronary disease  but  developed later in life.   SOCIAL HISTORY:  He is a Architectural technologist.  He does not smoke and never has.  He uses alcohol socially.  He is married and lives with his wife.   REVIEW OF SYSTEMS:  He had been in good health until this event.  He  denies any orthopnea, paroxysmal nocturnal dyspnea, or peripheral edema.  He has no history of excessive bleeding or bruising.  No recent change  in bowel or bladder habits.  No stroke or TIA symptoms.  All other  systems are negative.   PHYSICAL EXAMINATION:  GENERAL:  Mr. Marland is a well-appearing 60-year-  old white male in no acute distress.  He is well developed and well  nourished, thin, healthy-appearing male.  VITAL SIGNS:  His blood pressure is 140/80, pulse is 60 and regular,  respirations are 16.  NEUROLOGIC:  He is alert and oriented x3, appropriate, and grossly  intact.  HEENT:  Unremarkable.  NECK:  Supple without bruits.  CARDIAC:  Regular rate and rhythm.  Normal S1 and S2.  No rubs, murmurs,  or gallops.   LUNGS:  Clear with equal breath sounds bilaterally.  EXTREMITIES:  Well perfused.  He has 2 plus dorsalis pedis, posterior  tibial, and radial pulses bilaterally.  There is no clubbing, cyanosis,  or edema.   LABORATORY DATA:  CK was 53 with an MB of 0.8.  Troponin was 0.01.  CBC  had a white count of 7.8, hematocrit of 41, platelets 163.  TSH was  3.795.  Magnesium 2.5.  Sodium 138, potassium 3.4, chloride 106, CO2 25,  glucose 86, BUN creatinine 12 and 0.9.  His liver function tests were  within normal limits.  Albumin is 4.3.  Cholesterol is 125, HDL is 33.  PTT was 45 on heparin PT is 13.2 with an INR of 1.   IMPRESSION:  Mr. Camerer is a 60 year old gentleman with a strong family  history as well as a history of hyperlipidemia who presents with  crescendo angina which has now progressed to unstable symptoms.  At  catheterization he has 3-vessel coronary disease with particularly  severe disease in his left anterior descending artery involving the  ostium of the left anterior descending artery which is not amenable to  percutaneous intervention.   Coronary artery bypass grafting is indicated for survival benefit as  well as relief of symptoms.  I have discussed in detail with Mr. Neis  the indications, risks, benefits, and alternatives.  He understands the  general nature of the surgery including the need for general anesthesia  and the incision to be used.  We will plan to use bilateral mammary  arteries as well as saphenous vein which we will harvest endoscopically.  We will plan to do his LAD, obtuse marginal-1, and posterior descending.  We will evaluate his first and second diagonals intraoperatively to see  if they are large enough to graft.  They appear rather small by  catheterization but hopefully will be graftable vessels.   I discussed in detail with Mr. Hitchens the expected hospital stay and  overall recovery as well as return to work and other activities.  We did  discuss  the risk and benefits.  He understands that the risks include,  but are not limited to, death, stroke, MI, DVT, PE, bleeding and  possible need for transfusions, infections, as well as other organ  system  dysfunction including respiratory, renal, hepatic, or GI complications.  He understands  and accepts these risks and agrees to proceed.  We will  plan to proceed with surgery tomorrow morning the first case.  All the  patient's questions were answered.      Revonda Standard Roxan Hockey, M.D.  Electronically Signed     SCH/MEDQ  D:  03/11/2007  T:  03/11/2007  Job:  [id number]   cc:   Denice Bors. Stanford Breed, MD, [facility]  Bruce H. Swords, MD

## 2010-06-25 NOTE — Op Note (Signed)
[name], MATTERS NO.:  [id number]   MEDICAL RECORD NO.:  [id number]          PATIENT TYPE:  INP   LOCATION:  2302                         FACILITY:  [facility]   PHYSICIAN:  Revonda Standard. Roxan Hockey, M.D.DATE OF BIRTH:  1951/02/06   DATE OF PROCEDURE:  03/12/2007  DATE OF DISCHARGE:                               OPERATIVE REPORT   PREOPERATIVE DIAGNOSIS:  Three-vessel coronary disease with unstable  angina.   POSTOPERATIVE DIAGNOSIS:  Three-vessel coronary disease with unstable  angina.   PROCEDURE:  Median sternotomy, extracorporeal circulation, coronary  bypass grafting x5 (left internal mammary artery to LAD, free right  internal mammary artery to obtuse marginal 1, sequential saphenous vein  graft to first and second diagonal, saphenous vein graft to posterior  descending), endoscopic vein harvest right thigh.   SURGEON:  Revonda Standard. Roxan Hockey, M.D.   FIRST ASSISTANT:  John Giovanni, P.A.-C.   ANESTHESIA:  General.   FINDINGS:  Diagonal small 1-mm vessel.  Remaining vessels all good-  quality targets at the site of the anastomosis, good-quality conduits.   CLINICAL NOTE:  [name] is a 60 year old gentleman who presents with  progressive angina.  He has now progressed to having unstable symptoms.  At catheterization, he was found to have severe LAD disease as well as  moderate disease in his circumflex and right coronary.  The patient was  advised to undergo coronary bypass grafting.  Indications, risks,  benefits and alternatives were discussed in detail with the patient.  He  understood and accepted risks and agreed to proceed.  Plan was to use  bilateral mammary arteries for the main two threatened vessels, the LAD  and obtuse marginals.  The diagonals were too small to justify  utilization of an arterial conduit, and the posterior descending did not  have significant stenosis to justify use of arterial conduit.   OPERATIVE NOTE:  [name] was  brought the preoperative holding area on  March 12, 2007.  There, the anesthesia service placed lines for  monitoring arterial central venous and pulmonary arterial pressure.  Intravenous antibiotics were administered.  He was taken to the  operating room, anesthetized and intubated.  A Foley catheter was  placed.  The chest, abdomen and legs were prepped and draped in usual  fashion.  A median sternotomy was performed, and the left internal  mammary artery was harvested using standard technique.  Simultaneously,  incision was made in the medial aspect of the right leg at the level of  the knee, and the greater saphenous vein was harvested from the right  thigh endoscopically.  Greater saphenous vein was a good-quality vessel  as were both mammary arteries.  Five thousand units of heparin was  administered during the vessel harvest.   The right internal mammary artery then was harvested using standard  technique as well.  It likewise was a good-quality vessel but was of  insufficient length to reach to one of the two primary targets as a  pedicle graft.  Therefore, it was divided proximally.  Proximal stump  was suture ligated with a 2-0 silk  suture.   The pericardium was opened.  The ascending aorta was inspected.  It was  of normal size with no atherosclerotic disease.  The aorta was  cannulated via concentric 2 Ethilon pledgeted pursestring sutures.  A  dual stage venous cannula was placed via a pursestring suture in the  right appendage.  Cardiopulmonary bypass was instituted, and the patient  was cooled to 32 degrees Celsius.  The coronary arteries were inspected,  and anastomotic sites were chosen.  The conduits were inspected and cut  to length.  A foam pad was placed in the pericardium to protect the  phrenic nerve.  A temperature probe was placed in the myocardial septum,  and a cardioplegic cannula was placed in the ascending aorta.   The aorta was crossclamped.  Left  ventricle was emptied via aortic root  vent.  Cardiac arrest then was achieved with a combination of cold  antegrade blood cardioplegia and topical iced saline.  One liter of  cardioplegia was administered.  Myocardial septal temperature fell to 10  degrees Celsius.  There was a rapid diastolic arrest.  The following  distal anastomoses were performed.   First, a reverse saphenous vein graft was placed sequentially to the  first and second diagonal branches of the LAD.  These were both  relatively small branches 1 mm in diameter.  The second diagonal was of  better quality than the first.  Both did connect with the isolated  segments of the LAD.  Both anastomoses were performed with running 7-0  Prolene sutures.  Both were probed proximally and distally with a probe  at their completion.  There was satisfactory flow through the graft.  Cardioplegia was administered.  There was good hemostasis at the  anastomoses.   Next, a reversed saphenous vein graft was placed end-to-side to the  posterior descending branch of the right coronary.  There was  approximately 50% stenosis in the distal right coronary.  This target  was a 2-mm vessel which was of good quality.  The vein graft was  anastomosed end-to-side with a running 7-0 Prolene suture.  There was  excellent flow.  Cardioplegia was administered.  There was good  hemostasis.   Next, the distal end of the right mammary artery was beveled and was  anastomosed end-to-side to first obtuse marginal branch of the left  circumflex.  This was a 2-mm, good-quality target.  The mammary was 1.5  mm, good-quality vessel.  Anastomosis was performed with a running 8-0  Prolene suture.  There was good flow through the graft.   Additional cardioplegia was administered through the vein graft from the  aortic root.  There was good backbleeding from the right mammary graft.  Next, the left internal mammary artery was brought through a window in  the  pericardium.  Distal end was beveled and was anastomosed end-to-side  to the distal LAD.  There was significant disease just proximal to the  anastomosis but no significant disease distal to the anastomosis all the  way to the apex.  The LAD was a 1.5-mm vessel at the site of the  anastomosis.  The mammary was a 1.5-mm vessel.  The end-to-side  anastomosis was performed with a running 8-0 Prolene suture.  At the  completion of the mammary to LAD anastomosis, bulldog clamps were  briefly removed to inspect for hemostasis.  Septal rewarming was noted.  The bulldog clamp was replaced.  The mammary pedicle was tacked to the  epicardial surface of  the heart with 6-0 Prolene sutures.   Additional cardioplegia was administered.  The vein grafts were cut to  length.  The proximal vein graft anastomoses were performed with 4.5-mm  punch aortotomies with running 6-0 Prolene sutures.  A longitudinal  venotomy was made in the hood of the vein graft to the diagonal.  The  proximal end of the free right mammary graft was beveled and was  anastomosed to this venotomy with a running 7-0 Prolene suture.  At  completion of the final proximal anastomoses, the patient was placed in  Trendelenburg position.  Lidocaine was administered.  The bulldog clamp  was again removed from the left mammary artery.  The aortic root was de-  aired, and the aortic crossclamp was removed.  The total crossclamp time  was 88 minutes.   The patient required single defibrillation with 20 joules and remained  in sinus rhythm thereafter.  While the patient was being rewarmed, all  proximal and distal anastomoses were inspected for hemostasis.  Epicardial pacing wires were placed on the right ventricle and right  atrium.  When the patient had rewarmed to a core temperature of 37  degrees Celsius, he was weaned from cardiopulmonary bypass.  He was  atrially paced at 90 beats per minute at the time of separation from  bypass.  A  low-dose dopamine infusion at 3 mcg/kg per minute was  initiated shortly after weaning from bypass, as his first index was  relatively low.  He then had excellent cardiac function thereafter.   A test dose of protamine was administered and was well tolerated.  The  atrial and aortic cannulae were removed.  The remainder of the protamine  was administered without incident. The chest was irrigated with 1 liter  of normal saline containing 1 g of vancomycin.  Hemostasis was achieved.  The pericardium was reapproximated with interrupted 3-0 silk sutures.  It came together easily without tension.  Bilateral pleural tubes and  single mediastinal tube were placed through separate subcostal  incisions.  The sternum was closed with interrupted heavy gauge double  stainless steel wires.  The pectoralis fascia, subcutaneous tissue and  skin were closed in standard fashion.  All sponge, needle and instrument  counts were correct at the end of procedure.  The patient was taken from  the operating room to the surgical intensive care unit in good  condition.      Revonda Standard Roxan Hockey, M.D.  Electronically Signed     SCH/MEDQ  D:  03/12/2007  T:  03/13/2007  Job:  [id number]   cc:   Denice Bors. Stanford Breed, MD, [facility]  Bruce H. Swords, MD

## 2010-06-25 NOTE — Letter (Signed)
April 05, 2007   Denice Bors. Stanford Breed, MD, [facility] [place], [place] [address]   Re:  RONOLD, HARDGROVE                  DOB:  [birth year]   Dear Aaron Edelman:   I saw Kathryn Linarez back in the office today.  I believe he is scheduled  to see you this afternoon.  As you know Mr. Quintin presented with unstable  angina. He underwent coronary artery bypass grafting x5 with bilateral  mammaries as well as some saphenous vein.  On 02/3007 postoperative he  did well, home on postoperative day #4 and has continued to do  exceptionally well since his discharge home.  He is only about 3-1/2  weeks out but really looks about like most people do around 6 weeks out  from the surgery.  I am very, very pleased with his progress. I have  enclosed a copy of my office notes for your records.  Please do not  hesitate to contact me if you need any further assistance with his care  in the future.   Sincerely,   Revonda Standard. Roxan Hockey, M.D.  Electronically Signed   SCH/MEDQ  D:  04/05/2007  T:  04/05/2007  Job:  [id number]   cc:   Darrick Penna. Swords, MD

## 2010-06-25 NOTE — H&P (Signed)
[name], BRAHM NO.:  [id number]   MEDICAL RECORD NO.:  [id number]          PATIENT TYPE:  INP   LOCATION:  5631                         FACILITY:  [facility]   PHYSICIAN:  Denice Bors. Stanford Breed, MD, FACCDATE OF BIRTH:  1950/12/03   DATE OF ADMISSION:  03/10/2007  DATE OF DISCHARGE:                              HISTORY & PHYSICAL   [name] is a 60 year old male who is being admitted with unstable  angina.  He has no prior cardiac history.  Since January 10, the patient  has had exertional chest pain.  He describes it as a elephant sitting  on his chest.  It radiates to both upper extremities.  He predominantly  noticed this with exercise, and it was relieved with rest.  There was no  associated nausea, vomiting, shortness of breath or diaphoresis.  The  pain was not pleuritic or positional.  It was unlike his indigestion  that was related to food in the past.  He saw [name] last week, and  he had an exercise treadmill and did have chest pain on the treadmill,  but apparently there were no electrocardiographic changes.  On Sunday of  this week, he had an episode for 10 minutes while climbing out of his  tub.  He then had another episode yesterday while carrying a bag of  leaves to the curb.  He is not having pain at present.  Of note there is  no dyspnea on exertion, no orthopnea, PND, pedal edema, palpitations,  presyncope or syncope.  His present medications include Protonix 40 mg  p.o. 4 times per week, Lipitor 10 mg p.o. daily, aspirin 81 mg p.o.  daily, Niaspan 1 gram p.o. daily.   ALLERGIES:  He has no known drug allergies.   SOCIAL HISTORY:  He does not smoke, but he does occasionally consume  alcohol.   FAMILY HISTORY:  Positive for coronary disease as his father had  coronary artery bypass graft at his age.   PAST MEDICAL HISTORY:  There is no diabetes mellitus or hypertension,  but there is hyperlipidemia.  He does have a history of  indigestion/reflux.  He has had a prior left shoulder surgery,  tonsillectomy and appendectomy.  There is no other past medical history  noted.   REVIEW OF SYSTEMS:  He denies any headaches or fevers, chills.  There is  no productive cough or hemoptysis.  There is no dysphagia, odynophagia,  melena or hematochezia.  There is no dysuria, hematuria, __________ .  There is no orthopnea, PND or pedal edema.  The remaining systems are  negative.   PHYSICAL EXAMINATION:  VITAL SIGNS:  Today he is 165 pounds.  His blood  pressure is 142/94 and his pulse is 74.  GENERAL:  He is well-developed, well-nourished, in no acute distress.  Skin is warm, dry.  He is not depressed.  There is no peripheral  clubbing.  BACK:  Normal.  HEENT:  Normal with normal eyelids.  NECK:  Supple with no bruits.  There is no jugular venous distention and  no thyromegaly noted.  CHEST:  Clear to auscultation with normal expansion.  CARDIOVASCULAR:  Regular rhythm.  Normal S1 and S2.  There are no  murmurs, rubs or gallops noted.  ABDOMEN:  Nontender, nondistended.  Positive bowel sounds.  No  hepatosplenomegaly.  No masses appreciated.  There is no abdominal  bruit.  EXTREMITIES:  He has 2+ femoral pulses bilaterally.  No bruits.  Extremities show no edema, and I can palpate no cords.  He has 2+  posterior tibial pulses bilaterally.  NEUROLOGIC:  Grossly intact.   His electrocardiogram shows a sinus rhythm at a rate of 74.  The axis is  normal.  There are no significant ST changes.   DIAGNOSES:  1. New onset unstable angina - Mr. Spoelstra symptoms are classic for      angina.  They are recurring with less exertion and he had an      episode essentially at rest on Sunday.  We have discussed the      above, and I will admit to [facility] and begin aspirin,      heparin, Toprol and continue his statin.  We have discussed the      risks and benefits of cardiac catheterization, and he agrees to       proceed.  We will also cycle cardiac enzymes.  2. Hypertension - his blood pressure is elevated today, but we are      adding Toprol and we will monitor this in the hospital.  3. Hyperlipidemia - he will continue on a statin and Niaspan, and we      will check lipids and liver.   We will make further recommendations once we have the results of his  catheterization.  He will continue on his Protonix for his reflux.      Denice Bors Stanford Breed, MD, [facility]  Electronically Signed     BSC/MEDQ  D:  03/10/2007  T:  03/10/2007  Job:  [id number]

## 2010-06-25 NOTE — Assessment & Plan Note (Signed)
[facility]                            CARDIOLOGY OFFICE NOTE   JAION, LAGRANGE                         MRN:          [id number]  DATE:06/25/2007                            DOB:          08-08-1950    HISTORY OF PRESENT ILLNESS:  Mr. Holderness is a pleasant 60 year old  gentleman who underwent coronary bypassing graft in January.  Please  refer to my previous notes for details.  Since I last saw him, he is  doing extremely well.  There is no dyspnea, chest pain, palpitations or  syncope.  There is no pedal edema.  He is exercising routinely and  following the diet.  He does not smoke.   MEDICATIONS:  1. Aspirin 325 mg daily.  2. Lipitor 20 mg daily.  3. Protonix 40 mg daily.  4. Metoprolol 25 mg p.o. b.i.d.  5. Fish oil.  6. Niacin 1 gram daily.   PHYSICAL EXAMINATION:  VITAL SIGNS:  Blood pressure 122/85, pulse 68.  HEENT:  Normal.  NECK:  Supple.  CHEST:  Clear.  CARDIOVASCULAR:  Regular rate and rhythm.  ABDOMEN:  Shows no tenderness.  EXTREMITIES:  Show no edema.   STUDIES:  Electrocardiogram shows a sinus rhythm at a rate of 54.  There  is a first-degree AV block.  There are no ST changes noted.   DIAGNOSES:  1. Coronary artery disease status post bypass graft.  Mr. Pocock is      doing well with no chest pain or shortness of breath.  He will      continue with his aspirin, Lopressor, statin.  I will also add a      low-dose ACE inhibitor (lisinopril 5 mg daily).  We will check a B-      met in one week and follow his potassium and renal function.  2. Hyperlipidemia.  Mr. Strahm is now on his previous Lipitor, niacin      and fish oil.  When he returns for is B-met, we will check lipids      and liver and adjust with a goal LDL of less than 70.  3. History of gastric reflux disease.   He will continue risk factor modification.  We will see him back in 6  months.    Denice Bors Stanford Breed, MD, [facility]  Electronically Signed   BSC/MedQ  DD:  06/25/2007  DT: 06/25/2007  Job #: [id number]

## 2010-06-25 NOTE — Discharge Summary (Signed)
NAME:  [name], [name] NO.:  [id number]   MEDICAL RECORD NO.:  [id number]          PATIENT TYPE:  INP   LOCATION:  2005                         FACILITY:  [facility]   PHYSICIAN:  Revonda Standard. Roxan Hockey, M.D.DATE OF BIRTH:  [birth year]   DATE OF ADMISSION:  03/10/2007  DATE OF DISCHARGE:  03/16/2007                               DISCHARGE SUMMARY   HISTORY OF PRESENT ILLNESS:  Patient is a 60 year old gentleman admitted  to the hospital with unstable angina.  The patient had no prior cardiac  history.  Since January 10th, the patient had exertional chest pain that  he described as elephants sitting on my chest.  It radiated to both  upper extremities.  He noted the pain primarily with exercise, and it  was relieved with rest.  There was no associated nausea, vomiting,  shortness of breath, or diaphoresis.  The pain was not pleuritic or  positional.  The pain was not similar to indigestion, which he has  related to food in the past.  He saw [name], and an exercise  treadmill test was scheduled.  He had chest pain while doing the  treadmill, but there were no electrocardiographic changes.  On Sunday of  the week prior to admission, he had an episode for 10 minutes while  climbing out of his tub.  He also had an episode while carrying a bag of  leaves to the curb.  He was admitted for further evaluation and  treatment.   PAST MEDICAL HISTORY:  1. Hyperlipidemia.  2. Gastroesophageal reflux.  3. Previous left shoulder surgery.  4. History of tonsillectomy.  5. History of appendectomy.   ALLERGIES:  No known drug allergies.   SOCIAL HISTORY:  He does not smoke but does occasionally consume  alcohol.   FAMILY HISTORY:  Positive for coronary artery disease, who had coronary  artery bypass grafting at his same age.   MEDICATIONS PRIOR TO ADMISSION:  1. Protonix 40 mg 4 times weekly.  2. Lipitor 10 mg daily.  3. Aspirin 81 mg daily.  4. Niaspan 1 gm daily.   REVIEW OF SYSTEMS/PHYSICAL EXAM:  Please see the history and physical  done at the time of admission.   HOSPITAL COURSE:  Patient was admitted for further evaluation and  treatment.  He was felt to require cardiac catheterization.  On the  night of admission, the patient had a systolic episode.  It was  registered on the monitor as an 18-20 second asystole.  He recovered  quickly.  Upon arrival of cardiology, he was awake and alert.  He had no  chest pain.  EKG showed no ST changes with normal sinus rhythm.  He was  given intravenous fluids, and catheterization was scheduled for the  following day.  On March 11, 2007, he was taken to the cardiac  catheterization lab, where he was found to have severe three vessel  coronary artery disease with an ejection fraction of 50-55%.   A surgical consultation was obtained with Modesto Charon, M.D., who  evaluated the patient with studies, agreed with the recommendations to  proceed with surgical revascularization procedure.  On March 12, 2007,  he was taken to the operating room, at which time he underwent the  following procedure:  Coronary artery bypass grafting x5.  The following  grafts were placed:  1)  Left internal mammary artery to the LAD.  2)  Free right internal mammary artery to the OM.  3)  Sequential saphenous  vein graft to diagonal I and diagonal II.  4)  Saphenous vein graft at  posterior descending.  Patient tolerated the procedure well and was  taken to the surgical intensive care unit in stable condition.   POSTOPERATIVE HOSPITAL COURSE:  Patient has done quite well.  He has  maintained stable hemodynamics.  All routine lines, monitors, and  drainage devices were discontinued without difficulty.  He initially  required some vasodilators, but this was weaned without difficulty.  He  responded well to a gentle diuresis.  His incisions are all healing well  without signs of infection.  He is tolerating routine cardiac   rehabilitation modalities using standard protocols.  He has maintained  normal sinus rhythm with occasional sinus tachycardia.  He has been  placed on a beta blocker.  Incisions are healing well without evidence  of infection.  His laboratory values do reveal a moderate postoperative  anemia.  This has stabilized.  His most recent hemoglobin and hematocrit  dated March 15, 2007 is 8.4 and 24.2, respectively.  Electrolytes,  BUN, and creatinine are within normal limits.  The patient is overall  tentatively felt to be stable for discharge in the morning, March 16, 2007, pending morning round evaluations.   MEDICATIONS ON DISCHARGE:  1. Lipitor 20 mg daily.  2. Lopressor 25 mg twice daily.  3. Aspirin 325 mg daily.  4. Oxycodone 5 mg 1-2 every 4-6 hours as needed.   INSTRUCTIONS:  Patient received written instructions in regard to  medications, activity, diet, wound care, and followup.  Followup will  include Dr. Roxan Hockey on April 05, 2007 at 11:45.  He also is  instructed to follow up with Dr. Stanford Breed in two weeks post discharge.   FINAL DIAGNOSES:  1. Severe unstable angina with three-vessel disease, as described      above, now status post surgical revascularization, as described.  2. Postoperative anemia.  3. History of hyperlipidemia.  4. History of gastroesophageal reflux.  5. History of left shoulder surgery.  6. History of appendectomy.      John Giovanni, P.A.-C.      Revonda Standard Roxan Hockey, M.D.  Electronically Signed    WEG/MEDQ  D:  03/15/2007  T:  03/15/2007  Job:  [id number]   cc:   Denice Bors. Stanford Breed, MD, [facility]

## 2010-06-25 NOTE — Cardiovascular Report (Signed)
[name], [name] NO.:  [id number]   MEDICAL RECORD NO.:  [id number]          PATIENT TYPE:  INP   LOCATION:  8185                         FACILITY:  [facility]   PHYSICIAN:  Loretha Brasil. Lia Foyer, MD, FACCDATE OF BIRTH:  May 04, 1950   DATE OF PROCEDURE:  03/11/2007  DATE OF DISCHARGE:                            CARDIAC CATHETERIZATION   INDICATIONS:  This gentleman is very pleasant 60 year old who presents  with a history of exertional chest tightness.  He recently purchased a  treadmill and while getting on the treadmill noticed some tightness.  This was reproduced by exercise tolerance testing.  He was seen by Dr.  Stanford Breed and subsequently admitted for cardiac catheterization.  The  risks and benefits were discussed with the patient and explained prior  to the procedure.  He consented to proceed.   PROCEDURES:  1. Left heart catheterization.  2. Selective coronary arteriography.  3. Selective left ventriculography.  4. Subclavian angiography.   DESCRIPTION OF PROCEDURE:  The patient was brought to the  catheterization lab and prepped and draped in the usual fashion.  Subcutaneous Xylocaine was infused into the right femoral area.  A 5-  French sheath was placed with an anterior puncture.  Views of the left  and right coronary arteries were then obtained in several angiographic  projections.  Central aortic and left ventricular pressure was then  measured with a pigtail catheter.  Ventriculography was then performed  in the RAO projection.  We also took a picture of the subclavian using  the no-toe catheter.  There were no complications.  ACT was then checked  and the femoral sheath was sewn into place.  I reviewed the films with  Dr. Burt Knack.  I subsequently discussed the case with the patient and then  his wife.  Following this a call was made to the cardiothoracic  surgeons.  There were no complications.   HEMODYNAMIC DATA:  1. Central aortic pressure was  146/85, mean 114.  2. Left ventricular pressure 144/12.  3. There was no gradient pullback across aortic valve.   ANGIOGRAPHIC DATA:  1. On plain fluoroscopy there is evidence of modest calcification in      the left anterior descending territory.  2. Ventriculography in the RAO projection reveals preserved global      systolic function.  Ejection fraction would be estimated at about      50-55%.  There did appear to be perhaps very mild anterolateral      hypokinesis.  3. The subclavian and internal mammary appear to be widely patent.  4. The left main coronary artery demonstrates tapered narrowing at the      distal-most aspect.  The severity is difficult to grade but would      be estimated at around 20-30%.  5. The left anterior descending artery has ostial disease.  It is      segmental, extending all the way up to the left main interface.      This would be graded at 80-90%.  There is segmental plaquing      throughout the vessel with a  second 70% stenosis, then an 80%      stenosis distal to that.  The distal vessel was about 1.5-2 mm      artery.  It does appear to be suitable for grafting.  6. The circumflex coronary artery is a fairly large-caliber vessel      providing a fairly large marginal branch.  The area leading into      the marginal branch has about 70-80% focal narrowing but appears be      relatively smooth.  7. The right coronary artery has several lesions.  None appear to be      critical.  There is a 30% area of narrowing in the proximal-mid      junction, then 40% narrowing in the midvessel.  At the crux, there      probably is about 40-50% narrowing leading into the continuation      branch.   CONCLUSIONS:  1. Preserved left ventricular function with mild anterolateral      hypokinesis.  2. High-grade lesions of the left anterior descending artery including      the ostium.  3. Other lesions as noted above.   DISPOSITION:  Dr. Burt Knack and I have reviewed  the films.  There is not a  very good landing zone for a stent in the LAD.  In addition, there would  be three lesions requiring at least additional stenting, and the distal  stent would require a small non drug-eluting platform likely.  Given the  patient's history, he may best be served by revascularization surgery  including a mammary to the LAD.  A surgical consultation will be  obtained after review.      Loretha Brasil. Lia Foyer, MD, [facility]  Electronically Signed     TDS/MEDQ  D:  03/11/2007  T:  03/12/2007  Job:  [id number]   cc:   Darrick Penna. Leanne Chang, MD  Denice Bors. Stanford Breed, MD, [facility]  CV Laboratory

## 2010-06-25 NOTE — Assessment & Plan Note (Signed)
[place]                            CARDIOLOGY OFFICE NOTE   [name], [name]                         MRN:          [id number]  DATE:01/20/2008                            DOB:          Jun 15, 1950    The patient is a very pleasant gentleman who has a history of coronary  artery disease status post coronary bypassing graft in January 2009.  Note at that time, he had a LIMA to the LAD, free RIMA to the obtuse  marginal, and sequential saphenous vein graft to the first and second  diagonals, as well as saphenous vein graft to the PDA.  Since I last saw  him in May, he is doing extremely well.  There is no dyspnea on  exertion, orthopnea, PND, pedal edema, palpitations, presyncope,  syncope, or exertional chest pain.  He is following diet and he is  exercising routinely.  He does not smoke.   MEDICATIONS:  1. Aspirin 325 mg p.o. daily.  2. Lipitor 20 mg p.o. daily.  3. Protonix 40 mg p.o. daily.  4. Lopressor 25 mg p.o. b.i.d.  5. Lisinopril 5 mg p.o. daily.  6. Fish oil.  7. Niacin 1 g p.o. b.i.d.   PHYSICAL EXAMINATION:  VITAL SIGNS:  Today shows a blood pressure of  105/59, his pulse is 51.  HEENT:  Normal.  NECK:  Supple.  CHEST:  Clear.  CARDIOVASCULAR:  Bradycardic rate, but regular rhythm.  ABDOMEN:  No tenderness.  EXTREMITIES:  No edema.   His electrocardiogram shows sinus bradycardia at a rate of 51.  The axis  is normal.  There is no RV conduction delay, but there are no  significant ST changes noted.  There is a first-degree AV block.   DIAGNOSES:  1. Coronary artery disease status post coronary artery bypassing graft      - [name] is doing extremely well from symptomatic standpoint.  He      is exercising routinely and following a diet.  We will continue his      medical therapy to include his aspirin, beta-blocker, ACE      inhibitor, and statin.  I will change his Lopressor to Toprol 50 mg      p.o. daily.  2.  Hyperlipidemia - He will continue on his present medications and he      is being followed in our lipid clinic.  3. History of gastroesophageal reflux disease.   We will see him back in 12 months or sooner if necessary.     Denice Bors Stanford Breed, MD, [facility]  Electronically Signed    BSC/MedQ  DD: 01/20/2008  DT: 01/21/2008  Job #: [id number]

## 2010-06-25 NOTE — Assessment & Plan Note (Signed)
OFFICE VISIT   BARTT, GONZAGA  DOB:  1950/03/13                                        April 05, 2007  CHART #:  [id number]   Mr. Lierman is a 60 year old gentleman who presented unstable angina.  He  underwent coronary bypass grafting x5 with bilateral mammary arteries as  well as vein grafts.  Four of his five targets were good quality  vessels.  His diagonal 1 was small.  His conduits were good quality.  Postoperatively he did well.  He did not have significant postoperative  complications and was discharged hom on postoperative day #4.  Since  that time he has continued to do well.  He took pain pills for about  eight days after leaving the hospital, but has not had to take any  since.  He no longer even has pain when he coughs or sneezes.  He does  have some cutaneous hypersensitivity with irritation with his shirt  rubbing on his anterior chest wall skin, but no sternal pain whatsoever.  He did notice a lump at his 7th venectomy site, which has started to  decrease in size.  He has not had any redness or tenderness in that  area.  He states he has had no problems breathing.  He is walking about  45 minutes a day, splits that in to two separate times.  He did have  some concerns about his resting heart rate being higher than it was  preoperatively.  His blood pressures have been stable.   CURRENT MEDICATIONS:  1. Aspirin 325 mg daily.  2. Lopressor 25 mg b.i.d.  3. Lipitor 20 mg daily.  4. Oxycodone 5 mg p.r.n., but he has again, not taken that in the past      two weeks.   He has no known drug allergies.   PHYSICAL EXAMINATION:  Mr. Falkner is a well appearing, 60 year old white  male in no acute distress.  His blood pressure is 117/79, pulse 98,  respirations 16.  His oxygen saturation is 98% on room air.  LUNGS:  Clear with equal breath sounds bilaterally.  There is no rub.  CARDIAC:  Regular rate and rhythm.  Normal S1, S2.  No rubs, murmurs  or  gallops.  Sternum is sable. Sternal incision and chest tube sites are  well healed as are his leg incisions.  There is a small palpable  hematoma in the mid thigh from the saphenous venectomy.  There is no  erythema or induration.   LABORATORY DATA:  His chest x-ray shows small, bilateral effusions right  greater than left.   IMPRESSION:  Mr. Radin is doing exceptionally well at this point in time.  He is very motivated.  His exercise tolerance is excellent.  His  appetite is good.  He is really recovering ahead of schedule.  He may  begin driving.  Appropriate precautions were discussed in detail.  He is  not to lift any objects that weigh greater than 10 pounds for at least  another three weeks, and after that he can start to slowly and gradually  build up.  We discuss a plan for his returning to work gradually  beginning in about two to three weeks, and working up to full time in  about three months.  He has appointment to see Dr. Stanford Breed  this  afternoon.  He did have questions about restarting his niacin, fish oil  and multivitamin.  I told him he could go ahead and do that.  I would  happy to see him back if I can be of any further assistance with his  care.   Revonda Standard Roxan Hockey, M.D.  Electronically Signed   SCH/MEDQ  D:  04/05/2007  T:  04/05/2007  Job:  [id number]   cc:   Denice Bors. Stanford Breed, MD, [facility]  Bruce H. Swords, MD

## 2010-06-25 NOTE — Assessment & Plan Note (Signed)
[place]                            CARDIOLOGY OFFICE NOTE   AVYON, HERENDEEN                         MRN:          [id number]  DATE:04/05/2007                            DOB:          1950-04-20    Mr. Micale is a 60 year old gentleman that I recently admitted to [facility] with unstable angina.  The patient underwent cardiac  catheterization on March 11, 2007.  The ejection fraction was 50-55%  with mild anterolateral hypokinesis.  There was a 20-30% left main.  There was an 80-90% ostial LAD lesion.  There was also 70% stenosis and  80% stenosis distal to those lesions.  There is a 78% narrowing in the  circumflex leading into the marginal.  There was a 40-50% right coronary  artery.  The patient subsequently underwent coronary bypassing graft  with a LIMA to the LAD, free RIMA to the obtuse marginal one, sequential  70 vein graft to the first and second diagonal, and a saphenous vein  graft to the PDA.  The patient did well postoperatively and was  discharged.  Since then he denies any dyspnea, palpitations, or syncope.  He did initially have pedal edema which is improved.  Also had some pain  in the right medial thigh that was evaluated earlier by Dr. Roxan Hockey.  Apparently this is slowly improving as well.   NOTE:  Mr. Reiling does not smoke.   His medications include aspirin 325 mg p.o. daily the 420 mg daily  Protonix 40 mg daily, metoprolol 25 mg p.o. b.i.d.   PHYSICAL EXAM:  Shows a blood pressure of 119/78.  His pulse is 99.  Weight is 161 pounds.  HEENT is normal.  His neck is supple.  CHEST:  Clear.  CARDIOVASCULAR:  Regular rhythm.  ABDOMEN:  Exam shows no tenderness.  Sternotomy is without evidence of  infection.  EXTREMITIES:  Show no edema.   DIAGNOSES:  1. Coronary disease status post bypassing graft - Mr. Cheramie is doing      well from a symptomatic standpoint with no chest pain or shortness      of breath.  He  will continue his aspirin, statin and Lopressor.  2. Hyperlipidemia - Mr. Agard will continue on his Lipitor at 20 mg      daily and he will resume his niacin 500 mg p.o. b.i.d.  I will      check lipids and liver in 6 weeks and we will adjust as indicated.      Our goal LDL will be less than 70 given his history of coronary      disease.  3. History of gastroesophageal disease.   The patient will continue with risk factor modification including diet,  exercise.  We will see him back in 3 months.  Note he did have some  decreased breath sounds and apparently was noted  to have pleural effusions on chest x-ray earlier today by Dr.  Roxan Hockey.  He was placed on prednisone for 5 days and Dr. Roxan Hockey  is managing this.     Denice Bors  Stanford Breed, MD, [facility]  Electronically Signed    BSC/MedQ  DD: 04/05/2007  DT: 04/06/2007  Job #: [id number]

## 2010-08-14 ENCOUNTER — Other Ambulatory Visit: Payer: Self-pay | Admitting: Cardiology

## 2010-09-13 ENCOUNTER — Ambulatory Visit (INDEPENDENT_AMBULATORY_CARE_PROVIDER_SITE_OTHER): Payer: 59 | Admitting: Family Medicine

## 2010-09-13 ENCOUNTER — Encounter: Payer: Self-pay | Admitting: Family Medicine

## 2010-09-13 VITALS — BP 116/78 | HR 92 | Temp 98.2°F | Wt 159.0 lb

## 2010-09-13 DIAGNOSIS — Z8679 Personal history of other diseases of the circulatory system: Secondary | ICD-10-CM

## 2010-09-13 DIAGNOSIS — R55 Syncope and collapse: Secondary | ICD-10-CM

## 2010-09-13 NOTE — Progress Notes (Signed)
  Subjective:    Patient ID: [name], male    DOB: August 07, 1950, 60 y.o.   MRN: [id number]  HPI Here to follow up an incident which occurred yesterday afternoon at his dentist's office. He was in having a crown placed, and part of this included local anesthesia with lidocaine and epinephrine. A short time after this was given he felt weak, dizzy, nauseated, and sweaty and he almost passed out. EMS was called and they checked him out. After about 30 minutes these symptoms resolved, and he felt back to normal. The dentist actually went ahead and placed his crown after that. He did not go to the ER, but his dentist insisted that he see [place] today. He has felt fine ever since. No chest pain or SOB.    Review of Systems  Respiratory: Negative.   Cardiovascular: Negative.   Gastrointestinal: Positive for nausea. Negative for vomiting.  Neurological: Positive for weakness and light-headedness.       Objective:   Physical Exam  Constitutional: He is oriented to person, place, and time. He appears well-developed and well-nourished.  Neck: No thyromegaly present.  Cardiovascular: Normal rate, normal heart sounds and intact distal pulses.  Exam reveals no gallop and no friction rub.   No murmur heard.      Occasional ectopy . EKG shows sinus with an incomplete RBBB (no change from an EKG obtained on 02-26-10)   Pulmonary/Chest: Effort normal and breath sounds normal. No respiratory distress. He has no wheezes. He has no rales. He exhibits no tenderness.  Lymphadenopathy:    He has no cervical adenopathy.  Neurological: He is alert and oriented to person, place, and time. No cranial nerve deficit. He exhibits normal muscle tone. Coordination normal.          Assessment & Plan:  He had a simple vasovagal episode, probably triggered by the lidocaine and epinephrine. He seems to be fine today. Recheck prn

## 2010-09-14 ENCOUNTER — Other Ambulatory Visit: Payer: Self-pay | Admitting: Cardiology

## 2010-09-18 ENCOUNTER — Telehealth: Payer: Self-pay | Admitting: Cardiology

## 2010-09-18 MED ORDER — LISINOPRIL 5 MG PO TABS: 5.0000 mg | ORAL_TABLET | Freq: Every day | ORAL | Status: DC

## 2010-09-18 NOTE — Telephone Encounter (Signed)
Pt calling to verify that his lipitor ok to  be changed to generic?

## 2010-09-18 NOTE — Telephone Encounter (Signed)
Pt calling to verify it's ok with dr Stanford Breed that his lipitor be changed to generic?

## 2010-09-18 NOTE — Telephone Encounter (Signed)
Spoke with pt, okay given to use generic lipitor Fredia Beets

## 2010-10-31 LAB — POCT I-STAT 4, (NA,K, GLUC, HGB,HCT)
Glucose, Bld: 106 — ABNORMAL HIGH
Glucose, Bld: 114 — ABNORMAL HIGH
Glucose, Bld: 116 — ABNORMAL HIGH
Glucose, Bld: 93
HCT: 24 — ABNORMAL LOW
HCT: 25 — ABNORMAL LOW
Hemoglobin: 11.6 — ABNORMAL LOW
Hemoglobin: 12.6 — ABNORMAL LOW
Hemoglobin: 8.2 — ABNORMAL LOW
Hemoglobin: 8.5 — ABNORMAL LOW
Operator id: 113031
Operator id: 3402
Operator id: 3402
Potassium: 3.6
Potassium: 3.8
Potassium: 4.2
Sodium: 137
Sodium: 139

## 2010-10-31 LAB — APTT: aPTT: 46 — ABNORMAL HIGH

## 2010-10-31 LAB — CBC
HCT: 26.9 — ABNORMAL LOW
HCT: 27.3 — ABNORMAL LOW
HCT: 28.7 — ABNORMAL LOW
HCT: 40.4
HCT: 41.1
Hemoglobin: 14
Hemoglobin: 9.9 — ABNORMAL LOW
MCHC: 33.7
MCHC: 34.3
MCHC: 34.3
MCHC: 34.4
MCHC: 34.7
MCV: 86.9
MCV: 87
MCV: 87.3
Platelets: 101 — ABNORMAL LOW
Platelets: 108 — ABNORMAL LOW
Platelets: 174
Platelets: 207
Platelets: 99 — ABNORMAL LOW
RBC: 3.01 — ABNORMAL LOW
RBC: 4.64
RDW: 14.2
RDW: 14.2
RDW: 14.3
RDW: 14.3
RDW: 14.5
RDW: 14.6
WBC: 5.8
WBC: 7.2
WBC: 7.9

## 2010-10-31 LAB — CARDIAC PANEL(CRET KIN+CKTOT+MB+TROPI)
CK, MB: 0.8
CK, MB: 0.8
CK, MB: 1
CK, MB: 32.4 — ABNORMAL HIGH
Relative Index: INVALID
Relative Index: INVALID
Total CK: 53
Total CK: 60
Total CK: 644 — ABNORMAL HIGH
Troponin I: 0.01
Troponin I: 5.8
Troponin I: <0.01

## 2010-10-31 LAB — LIPID PANEL
Cholesterol: 125
HDL: 33 — ABNORMAL LOW
Total CHOL/HDL Ratio: 3.8
Triglycerides: 103

## 2010-10-31 LAB — POCT I-STAT 3, ART BLOOD GAS (G3+)
Acid-base deficit: 2
Acid-base deficit: 3 — ABNORMAL HIGH
Bicarbonate: 22
Operator id: 113031
Operator id: 260261
Operator id: 3402
Patient temperature: 36
TCO2: 23
pCO2 arterial: 36.3
pCO2 arterial: 46.6 — ABNORMAL HIGH
pH, Arterial: 7.297 — ABNORMAL LOW
pO2, Arterial: 135 — ABNORMAL HIGH
pO2, Arterial: 315 — ABNORMAL HIGH

## 2010-10-31 LAB — BASIC METABOLIC PANEL
BUN: 7
BUN: 7
BUN: 9
CO2: 28
Calcium: 7 — ABNORMAL LOW
Calcium: 8.4
Chloride: 110
Creatinine, Ser: 0.93
GFR calc Af Amer: >60
GFR calc non Af Amer: >60
GFR calc non Af Amer: >60
Glucose, Bld: 146 — ABNORMAL HIGH
Potassium: 3.6
Potassium: 4.5
Sodium: 139

## 2010-10-31 LAB — COMPREHENSIVE METABOLIC PANEL
ALT: 17
Albumin: 4.3
Alkaline Phosphatase: 65
Calcium: 9
Potassium: 3.4 — ABNORMAL LOW
Sodium: 138
Total Protein: 7.1

## 2010-10-31 LAB — I-STAT 8, (EC8 V) (CONVERTED LAB)
BUN: 15
Bicarbonate: 21.6
Chloride: 105
Glucose, Bld: 118 — ABNORMAL HIGH
pCO2, Ven: 39.1 — ABNORMAL LOW
pH, Ven: 7.35 — ABNORMAL HIGH

## 2010-10-31 LAB — CREATININE, SERUM
Creatinine, Ser: 1.01
GFR calc Af Amer: >60

## 2010-10-31 LAB — PROTIME-INR
INR: 1
Prothrombin Time: 13.2

## 2010-10-31 LAB — I-STAT EC8
Chloride: 108
Glucose, Bld: 119 — ABNORMAL HIGH
Potassium: 4.1
pH, Arterial: 7.389

## 2010-10-31 LAB — URINALYSIS, ROUTINE W REFLEX MICROSCOPIC
Glucose, UA: NEGATIVE
Hgb urine dipstick: NEGATIVE
Ketones, ur: NEGATIVE
pH: 6.5

## 2010-10-31 LAB — HEPARIN LEVEL (UNFRACTIONATED)
Heparin Unfractionated: 0.67
Heparin Unfractionated: 0.73 — ABNORMAL HIGH

## 2010-10-31 LAB — POCT I-STAT GLUCOSE: Operator id: 238831

## 2010-10-31 LAB — TYPE AND SCREEN
ABO/RH(D): B POS
Antibody Screen: NEGATIVE

## 2010-10-31 LAB — BLOOD GAS, ARTERIAL
Bicarbonate: 22.7
FIO2: 0.21
O2 Saturation: 96.9
Patient temperature: 97.9
pH, Arterial: 7.406

## 2010-10-31 LAB — HEMOGLOBIN A1C: Hgb A1c MFr Bld: 5.5

## 2010-10-31 LAB — MAGNESIUM
Magnesium: 2.3
Magnesium: 2.5

## 2010-11-01 LAB — CBC
HCT: 24.2 — ABNORMAL LOW
HCT: 24.5 — ABNORMAL LOW
Hemoglobin: 8.4 — ABNORMAL LOW
Hemoglobin: 8.4 — ABNORMAL LOW
MCHC: 34.6
MCV: 87.4
RBC: 2.77 — ABNORMAL LOW
RBC: 2.81 — ABNORMAL LOW
RDW: 15.1
WBC: 7.9

## 2010-11-01 LAB — BASIC METABOLIC PANEL
BUN: 14
CO2: 25
CO2: 28
Calcium: 7.5 — ABNORMAL LOW
Calcium: 7.5 — ABNORMAL LOW
GFR calc Af Amer: >60
GFR calc non Af Amer: >60
Glucose, Bld: 101 — ABNORMAL HIGH
Glucose, Bld: 94
Potassium: 3.3 — ABNORMAL LOW
Potassium: 3.8
Sodium: 137
Sodium: 140

## 2010-12-30 ENCOUNTER — Encounter: Payer: Self-pay | Admitting: Family Medicine

## 2010-12-30 ENCOUNTER — Ambulatory Visit (INDEPENDENT_AMBULATORY_CARE_PROVIDER_SITE_OTHER): Payer: 59 | Admitting: Family Medicine

## 2010-12-30 DIAGNOSIS — R1310 Dysphagia, unspecified: Secondary | ICD-10-CM

## 2010-12-30 NOTE — Progress Notes (Signed)
  Subjective:    Patient ID: [name], male    DOB: 1950/09/10, 60 y.o.   MRN: [id number]  HPI Here for some symptoms that started 4 weeks ago but that have seemingly resolved. About 4 weeks ago he developed a fullness or a lump sensation in the throat. There was no pain but often the food he was swallowing would seem to stop in the back of the throat, then it would gradually pass down after that. No SOB or coughing or hoarseness. He has never smoked. This happened consistently for a week, then it became intermittent for about 3 weeks. Now he has felt fine for the past week. He does have a hx of GERD and took Protonix for a time. Then he weaned himself of this last year. Now he has only rare heartburn which is relieved by TUMS. He also describes a separate set of symptoms recently which occur once in awhile. When swallowing a combination of things like rice and a carbonated drink, he suddenly feels a constriction in the throat that makes him cough. When he does cough a bit of "mucus" comes up and then he feels fine again.    Review of Systems  Constitutional: Negative.   HENT: Positive for trouble swallowing. Negative for sore throat, drooling and voice change.   Respiratory: Negative.   Cardiovascular: Negative.   Gastrointestinal: Negative.        Objective:   Physical Exam  Constitutional: He appears well-developed and well-nourished.  HENT:  Mouth/Throat: Oropharynx is clear and moist. No oropharyngeal exudate.  Eyes: Conjunctivae are normal. Pupils are equal, round, and reactive to light.  Neck: Normal range of motion. Neck supple. No tracheal deviation present. No thyromegaly present.  Pulmonary/Chest: No stridor.  Lymphadenopathy:    He has no cervical adenopathy.          Assessment & Plan:  Dysphagia, which may be related to silent reflux. He seems to have intermittent obstruction, so I think upper endoscopy would we warranted. We will refer to GI.

## 2011-01-20 ENCOUNTER — Ambulatory Visit (INDEPENDENT_AMBULATORY_CARE_PROVIDER_SITE_OTHER): Payer: 59 | Admitting: Internal Medicine

## 2011-01-20 ENCOUNTER — Other Ambulatory Visit: Payer: 59

## 2011-01-20 ENCOUNTER — Encounter: Payer: Self-pay | Admitting: Internal Medicine

## 2011-01-20 VITALS — BP 104/66 | HR 80 | Ht 70.0 in | Wt 160.8 lb

## 2011-01-20 DIAGNOSIS — K900 Celiac disease: Secondary | ICD-10-CM

## 2011-01-20 DIAGNOSIS — R1310 Dysphagia, unspecified: Secondary | ICD-10-CM

## 2011-01-20 DIAGNOSIS — K219 Gastro-esophageal reflux disease without esophagitis: Secondary | ICD-10-CM

## 2011-01-20 NOTE — Patient Instructions (Signed)
Your physician has requested that you go to the basement for lab work before leaving today  You have been scheduled for an endoscopy with propofol. Please follow written instructions given to you at your visit today.

## 2011-01-20 NOTE — Progress Notes (Signed)
HISTORY OF PRESENT ILLNESS:  [name] is a 60 y.o. male with hyperlipidemia, hypertension, and coronary artery disease status post CABG who presents for a regarding dysphagia. I have seen the patient on one occasion in January of 2011 when he presented for surveillance colonoscopy because of a history of adenomatous colon polyps. At that time 3 diminutive polyps were removed (2 adenomas). Followup in 5 years recommended. Patient reports prior history of reflux disease as manifested by classic symptoms area and he was placed on pantoprazole daily which relieved symptoms. He tells me that he took medication for about 2 years. However, due to concerns over potential side effects with long-term use, he weaned himself off the medication over the course of 6-8 months. Since that time (approximately year and a half) he has done well without symptoms. 2 months ago he began to develop a globus type sensation. As well as intermittent coughing and vague dysphagia symptoms. He was diagnosed with "silent reflux". Thereafter he made radical changes to his diet including avoidance of caffeine, carbonated beverages, alcohol, chocolate. Since the changes, he tells me that he has been asymptomatic. Of interest, the patient was found to be iron deficient with anemia earlier this year. Antibody testing revealed evidence of celiac disease. He has since been on a gluten-free diet and has had normalization of his ferritin and hemoglobin. He denies any active gastrointestinal symptoms prior to, or sense, the diagnosis of celiac disease. He has not had followup serologies or prior endoscopy.  REVIEW OF SYSTEMS:  All non-GI ROS negative except for cough  Past Medical History  Diagnosis Date  . GERD (gastroesophageal reflux disease)   . Hyperlipidemia   . Insomnia   . CAD (coronary artery disease)     Past Surgical History  Procedure Date  . Coronary artery bypass graft   . Appendectomy   . Tonsilectomy, adenoidectomy,  bilateral myringotomy and tubes   . Shoulder surgery     left    Social History [name]  reports that he has never smoked. He has never used smokeless tobacco. He reports that he drinks about .5 ounces of alcohol per week. He reports that he does not use illicit drugs.  family history includes Dementia in his father; Heart attack in his mother; Heart disease in his father; Hemochromatosis in his brother; Hypertension in his father and mother; and Parkinsonism in his father.  Allergies  Allergen Reactions  . Epipen (Epinephrine Hcl)     syncope       PHYSICAL EXAMINATION: Vital signs: BP 104/66  Pulse 80  Ht 5' 10"  (1.778 m)  Wt 160 lb 12.8 oz (72.938 kg)  BMI 23.07 kg/m2  Constitutional: generally well-appearing, no acute distress Psychiatric: alert and oriented x3, cooperative Eyes: extraocular movements intact, anicteric, conjunctiva pink Mouth: oral pharynx moist, no lesions Neck: supple no lymphadenopathy Cardiovascular: heart regular rate and rhythm, no murmur Lungs: clear to auscultation bilaterally Abdomen: soft, nontender, nondistended, no obvious ascites, no peritoneal signs, normal bowel sounds, no organomegaly Rectal: Ommitted Extremities: no lower extremity edema bilaterally Skin: no lesions on visible extremities Neuro: No focal deficits.   ASSESSMENT:  #1. Chronic GERD. History of both typical and atypical symptoms. Currently doing well with dietary measures alone #2. Celiac sprue by serologies #3. History of iron deficiency anemia secondary to #2 #4. History of adenomatous colon polyps. Last colonoscopy January 2011   PLAN:  #1. Continue reflux precautions #2. Followup tissue transglutaminase antibody on gluten-free diet #3. Schedule upper endoscopy to  evaluate chronic GERD symptoms and dysphagia. Rule out Barrett's esophagus. Also, duodenal biopsies to rule out villous atrophy.The nature of the procedure, as well as the risks, benefits, and  alternatives were carefully and thoroughly reviewed with the patient. Ample time for discussion and questions allowed. The patient understood, was satisfied, and agreed to proceed. CRNA administered propofol without pharyngeal anesthesia given question of lidocaine allergy #4. Surveillance colonoscopy around January 2016

## 2011-01-21 ENCOUNTER — Encounter: Payer: Self-pay | Admitting: Internal Medicine

## 2011-02-13 ENCOUNTER — Encounter: Payer: 59 | Admitting: Internal Medicine

## 2011-02-19 ENCOUNTER — Telehealth: Payer: Self-pay | Admitting: Internal Medicine

## 2011-02-21 ENCOUNTER — Encounter: Payer: 59 | Admitting: Internal Medicine

## 2011-03-06 ENCOUNTER — Encounter: Payer: Self-pay | Admitting: Internal Medicine

## 2011-03-06 ENCOUNTER — Ambulatory Visit (AMBULATORY_SURGERY_CENTER): Payer: 59 | Admitting: Internal Medicine

## 2011-03-06 VITALS — BP 146/83 | HR 84 | Resp 15

## 2011-03-06 DIAGNOSIS — K900 Celiac disease: Secondary | ICD-10-CM

## 2011-03-06 DIAGNOSIS — K219 Gastro-esophageal reflux disease without esophagitis: Secondary | ICD-10-CM

## 2011-03-06 DIAGNOSIS — D509 Iron deficiency anemia, unspecified: Secondary | ICD-10-CM

## 2011-03-06 DIAGNOSIS — K2900 Acute gastritis without bleeding: Secondary | ICD-10-CM

## 2011-03-06 DIAGNOSIS — R1310 Dysphagia, unspecified: Secondary | ICD-10-CM

## 2011-03-06 MED ORDER — SODIUM CHLORIDE 0.9 % IV SOLN: 500.0000 mL | INTRAVENOUS | Status: DC

## 2011-03-06 MED ORDER — PANTOPRAZOLE SODIUM 40 MG PO TBEC: 40.0000 mg | DELAYED_RELEASE_TABLET | Freq: Every day | ORAL | Status: DC

## 2011-03-06 NOTE — Op Note (Signed)
[facility] [address] [facility] [address] [place], [place]  [address]  ENDOSCOPY PROCEDURE REPORT  PATIENT:  Rodney, Warren  MR#:  [id number] BIRTHDATE:  [birth year], 60 yrs. old  GENDER:  male  ENDOSCOPIST:  Docia Chuck. Geri Seminole, MD Referred by:  Office  PROCEDURE DATE:  03/06/2011 PROCEDURE:  EGD with biopsy, 84[address] ASA CLASS:  Class II INDICATIONS:  GERD, globus, follow-up of celiac disease  MEDICATIONS:   MAC sedation, administered by CRNA, propofol (Diprivan) 150 mcg IV TOPICAL ANESTHETIC:  none  DESCRIPTION OF PROCEDURE:   After the risks benefits and alternatives of the procedure were thoroughly explained, informed consent was obtained.  The LB GIF-H180 [id number] endoscope was introduced through the mouth and advanced to the second portion of the duodenum, without limitations.  The instrument was slowly withdrawn as the mucosa was fully examined. <<PROCEDUREIMAGES>>  Erosive Esophagitis was found in the distal esophagus with early stricture formation. No Barrett's..  A 4cm hiatal hernia was found with Cameron's lesions.  Mild gastritis was found in the antrum. CLO BX taken. Otherwise the examination was normal to second duodenum.    Retroflexed views revealed the hiatal hernia.    The scope was then withdrawn from the patient and the procedure completed.  COMPLICATIONS:  None  ENDOSCOPIC IMPRESSION: 1) Erosive Esophagitis in the distal esophagus wit hearly stricture formation 2) Hiatal hernia with Lysbeth Galas erosions 3) Mild antral Gastritis 4) Otherwise normal exam  RECOMMENDATION: 1) Anti-reflux regimen to be followed 2) Rx CLO if positive 3) Omeprazole 40 mg daily ; #30; one PO q day;  11 refills  ______________________________ Docia Chuck. Geri Seminole, MD  CC:  [name], MD;  The Patient  n. eSIGNED:   Docia Chuck. Geri Seminole at 03/06/2011 10:44 AM  Jolyn Nap, [phone]

## 2011-03-06 NOTE — Patient Instructions (Signed)
Green and blue discharge instructions reviewed with patient and care partner.  Impression/recommendations:  Erosive esophagitis (handout given) Hiatal hernia (handout given) Gastritis (handout given)  Resume medications as you were taking them prior to your procedure. Begin omeprazole 40 mg daily, one per day.

## 2011-03-06 NOTE — Progress Notes (Signed)
Patient did not experience any of the following events: a burn prior to discharge; a fall within the facility; wrong site/side/patient/procedure/implant event; or a hospital transfer or hospital admission upon discharge from the facility. (G8907) Patient did not have preoperative order for IV antibiotic SSI prophylaxis. (G8918)  

## 2011-03-07 ENCOUNTER — Telehealth: Payer: Self-pay | Admitting: *Deleted

## 2011-03-07 NOTE — Telephone Encounter (Signed)
Left message on follow up call back.

## 2011-04-11 ENCOUNTER — Telehealth: Payer: Self-pay | Admitting: Cardiology

## 2011-04-11 DIAGNOSIS — E7800 Pure hypercholesterolemia, unspecified: Secondary | ICD-10-CM

## 2011-04-11 DIAGNOSIS — Z79899 Other long term (current) drug therapy: Secondary | ICD-10-CM

## 2011-04-11 NOTE — Telephone Encounter (Signed)
Spoke with pt, oder for lipid/liver bmp ordered.

## 2011-04-11 NOTE — Telephone Encounter (Signed)
Patient needs order for yearly labs sent to [facility]    Patient yearly f/u sched for 05/08/11, please send order for labs to [facility] office for 05/01/11. Patient can be reached at [phone] should you have any additional questions.

## 2011-05-01 ENCOUNTER — Other Ambulatory Visit (INDEPENDENT_AMBULATORY_CARE_PROVIDER_SITE_OTHER): Payer: 59

## 2011-05-01 ENCOUNTER — Encounter: Payer: Self-pay | Admitting: *Deleted

## 2011-05-01 DIAGNOSIS — E7800 Pure hypercholesterolemia, unspecified: Secondary | ICD-10-CM

## 2011-05-01 DIAGNOSIS — Z79899 Other long term (current) drug therapy: Secondary | ICD-10-CM

## 2011-05-01 LAB — LIPID PANEL
Cholesterol: 105 mg/dL (ref 0–200)
HDL: 46 mg/dL (ref >39.00)
LDL Cholesterol: 49 mg/dL (ref 0–99)
Total CHOL/HDL Ratio: 2
Triglycerides: 49 mg/dL (ref 0.0–149.0)

## 2011-05-01 LAB — HEPATIC FUNCTION PANEL
Albumin: 4.1 g/dL (ref 3.5–5.2)
Total Protein: 7.1 g/dL (ref 6.0–8.3)

## 2011-05-01 LAB — BASIC METABOLIC PANEL
CO2: 28 mEq/L (ref 19–32)
Calcium: 9.1 mg/dL (ref 8.4–10.5)
Chloride: 104 mEq/L (ref 96–112)
Creatinine, Ser: 0.9 mg/dL (ref 0.4–1.5)
Sodium: 137 mEq/L (ref 135–145)

## 2011-05-08 ENCOUNTER — Ambulatory Visit: Payer: 59 | Admitting: Cardiology

## 2011-05-13 NOTE — Telephone Encounter (Signed)
Pt had endo on 03-06-11

## 2011-06-03 ENCOUNTER — Encounter: Payer: Self-pay | Admitting: *Deleted

## 2011-06-04 ENCOUNTER — Encounter: Payer: Self-pay | Admitting: Cardiology

## 2011-06-04 ENCOUNTER — Ambulatory Visit (INDEPENDENT_AMBULATORY_CARE_PROVIDER_SITE_OTHER): Payer: 59 | Admitting: Cardiology

## 2011-06-04 VITALS — BP 120/74 | HR 62 | Ht 70.0 in | Wt 158.0 lb

## 2011-06-04 DIAGNOSIS — I2510 Atherosclerotic heart disease of native coronary artery without angina pectoris: Secondary | ICD-10-CM

## 2011-06-04 DIAGNOSIS — E785 Hyperlipidemia, unspecified: Secondary | ICD-10-CM

## 2011-06-04 DIAGNOSIS — I25810 Atherosclerosis of coronary artery bypass graft(s) without angina pectoris: Secondary | ICD-10-CM

## 2011-06-04 NOTE — Patient Instructions (Signed)
Your physician wants you to follow-up in: ONE YEAR WITH DR CRENSHAW You will receive a reminder letter in the mail two months in advance. If you don't receive a letter, please call our office to schedule the follow-up appointment.  

## 2011-06-04 NOTE — Progress Notes (Signed)
HPI: The patient is a very pleasant gentleman who has a history of coronary artery disease status post coronary bypassing graft in January 2009. Note at that time, he had a LIMA to the LAD, free RIMA to the obtuse marginal, and sequential saphenous vein graft to the first and second diagonals, as well as saphenous vein graft to the PDA.  Note preoperative carotid Dopplers performed revealed no significant right or left internal carotid artery stenosis. Since I last saw in Jan 2012 the patient denies any dyspnea on exertion, orthopnea, PND, pedal edema, palpitations, syncope or chest pain.  Current Outpatient Prescriptions  Medication Sig Dispense Refill  . aspirin 325 MG tablet Take 325 mg by mouth daily.        . calcium citrate-vitamin D (CITRACAL+D) 315-200 MG-UNIT per tablet 2 tabs po am and 1 tab pm      . Ferrous Sulfate Dried (IRON) 160 (50 FE) MG TBCR Take 1 tablet by mouth daily.       . fish oil-omega-3 fatty acids 1000 MG capsule Take 2 g by mouth daily.        Marland Kitchen LIPITOR 20 MG tablet take 1 tablet by mouth at bedtime  30 tablet  12  . lisinopril (PRINIVIL,ZESTRIL) 5 MG tablet Take 1 tablet (5 mg total) by mouth daily.  30 tablet  12  . Multiple Vitamin (MULTIVITAMIN) tablet Take 1 tablet by mouth daily.        . niacin 250 MG tablet Take 500 mg by mouth daily with breakfast. Take 2      . pantoprazole (PROTONIX) 40 MG tablet Take 1 tablet (40 mg total) by mouth daily.  30 tablet  11  . vitamin A 8000 UNIT capsule Take 8,000 Units by mouth daily.         Current Facility-Administered Medications  Medication Dose Route Frequency Provider Last Rate Last Dose  . 0.9 %  sodium chloride infusion  500 mL Intravenous Continuous Irene Shipper, MD         Past Medical History  Diagnosis Date  . GERD (gastroesophageal reflux disease)   . Hyperlipidemia   . Insomnia   . CAD (coronary artery disease)   . Celiac sprue   . OTITIS EXTERNA, ACUTE, LEFT     Past Surgical History    Procedure Date  . Coronary artery bypass graft   . Appendectomy   . Tonsilectomy, adenoidectomy, bilateral myringotomy and tubes   . Shoulder surgery     left  . Nasal septoplasty w/ turbinoplasty     History   Social History  . Marital Status: Married    Spouse Name: N/A    Number of Children: N/A  . Years of Education: N/A   Occupational History  . Not on file.   Social History Main Topics  . Smoking status: Never Smoker   . Smokeless tobacco: Never Used  . Alcohol Use: 0.5 oz/week    1 drink(s) per week  . Drug Use: No  . Sexually Active: Not on file   Other Topics Concern  . Not on file   Social History Narrative  . No narrative on file    ROS: no fevers or chills, productive cough, hemoptysis, dysphasia, odynophagia, melena, hematochezia, dysuria, hematuria, rash, seizure activity, orthopnea, PND, pedal edema, claudication. Remaining systems are negative.  Physical Exam: Well-developed well-nourished in no acute distress.  Skin is warm and dry.  HEENT is normal.  Neck is supple.  Chest is clear to  auscultation with normal expansion.  Cardiovascular exam is regular rate and rhythm.  Abdominal exam nontender or distended. No masses palpated. Extremities show no edema. neuro grossly intact  ECG sinus rhythm at a rate of 62. First-degree AV block. Incomplete right bundle branch block. No ST changes.

## 2011-06-04 NOTE — Assessment & Plan Note (Signed)
Continue aspirin and statin. Plan Myoview when he returns in one year. He exercises routinely, follows a diet and does not smoke.

## 2011-06-04 NOTE — Assessment & Plan Note (Signed)
Continue statin. Lipids and liver recently checked and at goal.

## 2011-08-15 ENCOUNTER — Other Ambulatory Visit: Payer: Self-pay | Admitting: Cardiology

## 2011-10-20 ENCOUNTER — Other Ambulatory Visit: Payer: Self-pay | Admitting: Cardiology

## 2012-02-25 ENCOUNTER — Other Ambulatory Visit: Payer: Self-pay | Admitting: Internal Medicine

## 2012-05-24 ENCOUNTER — Telehealth: Payer: Self-pay

## 2012-05-24 ENCOUNTER — Telehealth: Payer: Self-pay | Admitting: Internal Medicine

## 2012-05-24 MED ORDER — PANTOPRAZOLE SODIUM 40 MG PO TBEC: 40.0000 mg | DELAYED_RELEASE_TABLET | Freq: Every day | ORAL | Status: DC

## 2012-05-24 NOTE — Telephone Encounter (Signed)
No please schedule injection appt

## 2012-05-24 NOTE — Telephone Encounter (Signed)
Refilled Pantoprazole

## 2012-05-24 NOTE — Telephone Encounter (Signed)
Pt wants to know if he's due for his shingles shot. Please advise.

## 2012-06-07 ENCOUNTER — Ambulatory Visit (INDEPENDENT_AMBULATORY_CARE_PROVIDER_SITE_OTHER): Payer: 59 | Admitting: Internal Medicine

## 2012-06-07 DIAGNOSIS — Z23 Encounter for immunization: Secondary | ICD-10-CM

## 2012-06-07 DIAGNOSIS — Z2911 Encounter for prophylactic immunotherapy for respiratory syncytial virus (RSV): Secondary | ICD-10-CM

## 2012-06-25 ENCOUNTER — Encounter: Payer: Self-pay | Admitting: Cardiology

## 2012-06-25 ENCOUNTER — Telehealth: Payer: Self-pay | Admitting: Cardiology

## 2012-06-25 ENCOUNTER — Ambulatory Visit (INDEPENDENT_AMBULATORY_CARE_PROVIDER_SITE_OTHER): Payer: 59 | Admitting: Cardiology

## 2012-06-25 VITALS — BP 110/78 | HR 66 | Wt 159.0 lb

## 2012-06-25 DIAGNOSIS — I2510 Atherosclerotic heart disease of native coronary artery without angina pectoris: Secondary | ICD-10-CM

## 2012-06-25 LAB — LIPID PANEL
Cholesterol: 124 mg/dL (ref 0–200)
HDL: 50.4 mg/dL (ref >39.00)
LDL Cholesterol: 54 mg/dL (ref 0–99)
Total CHOL/HDL Ratio: 2
Triglycerides: 97 mg/dL (ref 0.0–149.0)

## 2012-06-25 LAB — HEPATIC FUNCTION PANEL
AST: 25 U/L (ref 0–37)
Albumin: 4.2 g/dL (ref 3.5–5.2)
Alkaline Phosphatase: 59 U/L (ref 39–117)
Total Protein: 7 g/dL (ref 6.0–8.3)

## 2012-06-25 NOTE — Patient Instructions (Addendum)
Your physician wants you to follow-up in: [place] will receive a reminder letter in the mail two months in advance. If you don't receive a letter, please call our office to schedule the follow-up appointment.   Your physician recommends that you HAVE LAB WORK TODAY  Your physician has requested that you have en exercise stress myoview. For further information please visit [email or web address]. Please follow instruction sheet, as given.

## 2012-06-25 NOTE — Telephone Encounter (Signed)
Spoke with pt, his last lipid check here was one year ago. He will fast until his appt today

## 2012-06-25 NOTE — Telephone Encounter (Signed)
New problem   Pt want to know if he need to do a lipid test today for his appt. He haven't eaten anything yet and want you to call him to advise. Please call pt.

## 2012-06-25 NOTE — Progress Notes (Signed)
HPI: The patient is a very pleasant gentleman who has a history of coronary artery disease status post coronary bypassing graft in January 2009. Note at that time, he had a LIMA to the LAD, free RIMA to the obtuse marginal, and sequential saphenous vein graft to the first and second diagonals, as well as saphenous vein graft to the PDA. Note preoperative carotid Dopplers performed revealed no significant right or left internal carotid artery stenosis. Since I last saw in April of 2013 the patient denies any dyspnea on exertion, orthopnea, PND, pedal edema, palpitations, syncope or chest pain.   Current Outpatient Prescriptions  Medication Sig Dispense Refill  . aspirin 325 MG tablet Take 325 mg by mouth daily.        Marland Kitchen atorvastatin (LIPITOR) 20 MG tablet take 1 tablet by mouth at bedtime  30 tablet  12  . calcium citrate-vitamin D (CITRACAL+D) 315-200 MG-UNIT per tablet 2 tabs po am and 1 tab pm      . Ferrous Sulfate Dried (IRON) 160 (50 FE) MG TBCR Take 1 tablet by mouth daily.       . fish oil-omega-3 fatty acids 1000 MG capsule 1/2 tab po qd      . lisinopril (PRINIVIL,ZESTRIL) 5 MG tablet take 1 tablet by mouth once daily  30 tablet  12  . Multiple Vitamin (MULTIVITAMIN) tablet Take 3 tablets by mouth daily.       . niacin 250 MG tablet Take 500 mg by mouth daily with breakfast.       . pantoprazole (PROTONIX) 40 MG tablet take 1 tablet by mouth once daily  30 tablet  2  . vitamin A 8000 UNIT capsule Take 8,000 Units by mouth daily.         Current Facility-Administered Medications  Medication Dose Route Frequency Provider Last Rate Last Dose  . 0.9 %  sodium chloride infusion  500 mL Intravenous Continuous Irene Shipper, MD         Past Medical History  Diagnosis Date  . GERD (gastroesophageal reflux disease)   . Hyperlipidemia   . Insomnia   . CAD (coronary artery disease)   . Celiac sprue   . OTITIS EXTERNA, ACUTE, LEFT     Past Surgical History  Procedure Laterality Date    . Coronary artery bypass graft    . Appendectomy    . Tonsilectomy, adenoidectomy, bilateral myringotomy and tubes    . Shoulder surgery      left  . Nasal septoplasty w/ turbinoplasty      History   Social History  . Marital Status: Married    Spouse Name: N/A    Number of Children: N/A  . Years of Education: N/A   Occupational History  . Not on file.   Social History Main Topics  . Smoking status: Never Smoker   . Smokeless tobacco: Never Used  . Alcohol Use: 0.5 oz/week    1 drink(s) per week  . Drug Use: No  . Sexually Active: Not on file   Other Topics Concern  . Not on file   Social History Narrative  . No narrative on file    ROS: no fevers or chills, productive cough, hemoptysis, dysphasia, odynophagia, melena, hematochezia, dysuria, hematuria, rash, seizure activity, orthopnea, PND, pedal edema, claudication. Remaining systems are negative.  Physical Exam: Well-developed well-nourished in no acute distress.  Skin is warm and dry.  HEENT is normal.  Neck is supple.  Chest is clear to auscultation with  normal expansion.  Cardiovascular exam is regular rate and rhythm.  Abdominal exam nontender or distended. No masses palpated. Extremities show no edema. neuro grossly intact  ECG sinus rhythm at a rate of 66. RV conduction delay. First degree AV block. No ST changes.

## 2012-06-25 NOTE — Assessment & Plan Note (Signed)
Continue statin. Check lipids and liver. 

## 2012-06-25 NOTE — Assessment & Plan Note (Signed)
Continue aspirin and statin. Schedule Myoview for risk stratification.

## 2012-07-07 ENCOUNTER — Ambulatory Visit (HOSPITAL_COMMUNITY): Payer: 59 | Attending: Cardiovascular Disease | Admitting: Radiology

## 2012-07-07 VITALS — BP 112/77 | HR 57 | Ht 70.0 in | Wt 158.0 lb

## 2012-07-07 DIAGNOSIS — E785 Hyperlipidemia, unspecified: Secondary | ICD-10-CM | POA: Insufficient documentation

## 2012-07-07 DIAGNOSIS — Z951 Presence of aortocoronary bypass graft: Secondary | ICD-10-CM | POA: Insufficient documentation

## 2012-07-07 DIAGNOSIS — I2510 Atherosclerotic heart disease of native coronary artery without angina pectoris: Secondary | ICD-10-CM | POA: Insufficient documentation

## 2012-07-07 DIAGNOSIS — I10 Essential (primary) hypertension: Secondary | ICD-10-CM | POA: Insufficient documentation

## 2012-07-07 DIAGNOSIS — Z8249 Family history of ischemic heart disease and other diseases of the circulatory system: Secondary | ICD-10-CM | POA: Insufficient documentation

## 2012-07-07 MED ORDER — TECHNETIUM TC 99M SESTAMIBI GENERIC - CARDIOLITE
11.0000 | Freq: Once | INTRAVENOUS | Status: AC | PRN
  Administered 2012-07-07: 11 via INTRAVENOUS

## 2012-07-07 MED ORDER — TECHNETIUM TC 99M SESTAMIBI GENERIC - CARDIOLITE
33.0000 | Freq: Once | INTRAVENOUS | Status: AC | PRN
  Administered 2012-07-07: 33 via INTRAVENOUS

## 2012-07-07 NOTE — Progress Notes (Signed)
[facility] [address] [place], [place] [address] [phone]    Cardiology Nuclear Med Study  [name] is a 62 y.o. male     MRN : [id number]     DOB: [birth year]  Procedure Date: 07/07/2012  Nuclear Med Background Indication for Stress Test:  Evaluation for Ischemia and Graft Patency History:  '09 GXT:no ischemia, + CP>Cath, EF=55%>CABG Cardiac Risk Factors: Family History - CAD, Hypertension and Lipids  Symptoms:  No cardiac complaints.   Nuclear Pre-Procedure Caffeine/Decaff Intake:  None NPO After: 8:30pm   Lungs:  Clear. O2 Sat: 98% on room air. IV 0.9% NS with Angio Cath:  22g  IV Site: R Hand  IV Started by:  Crissie Figures, RN  Chest Size (in):  42 Cup Size: n/a  Height: 5' 10"  (1.778 m)  Weight:  158 lb (71.668 kg)  BMI:  Body mass index is 22.67 kg/(m^2). Tech Comments:  N/A    Nuclear Med Study 1 or 2 day study: 1 day  Stress Test Type:  Stress  Reading MD: Mertie Moores, MD  Order Authorizing Provider:  Kirk Ruths, MD  Resting Radionuclide: Technetium 50mSestamibi  Resting Radionuclide Dose: 11.0 mCi   Stress Radionuclide:  Technetium 949mestamibi  Stress Radionuclide Dose: 33.0 mCi           Stress Protocol Rest HR: 57 Stress HR: 148  Rest BP: 112/77 Stress BP: 170/82  Exercise Time (min): 11:01 METS: 13.4   Predicted Max HR: 158 bpm % Max HR: 93.67 bpm Rate Pressure Product: 25160   Dose of Adenosine (mg):  n/a Dose of Lexiscan: n/a mg  Dose of Atropine (mg): n/a Dose of Dobutamine: n/a mcg/kg/min (at max HR)  Stress Test Technologist: ShLetta MoynahanCMA-N  Nuclear Technologist:  StCharlton AmorCNMT     Rest Procedure:  Myocardial perfusion imaging was performed at rest 45 minutes following the intravenous administration of Technetium 9959mstamibi.  Rest ECG: NSR - Normal EKG  Stress Procedure:  The patient exercised on the treadmill utilizing the Bruce Protocol for 11:01 minutes. The patient stopped  due to leg fatigue and denied any chest pain.  Technetium 34m7mtamibi was injected at peak exercise and myocardial perfusion imaging was performed after a brief delay.  Stress ECG: No significant change from baseline ECG  QPS Raw Data Images:  Normal; no motion artifact; normal heart/lung ratio. Stress Images:  Normal homogeneous uptake in all areas of the myocardium. Rest Images:  Normal homogeneous uptake in all areas of the myocardium. Subtraction (SDS):  No evidence of ischemia. Transient Ischemic Dilatation (Normal <1.22):  1.04 Lung/Heart Ratio (Normal <0.45):  0.22  Quantitative Gated Spect Images QGS EDV:  81 ml QGS ESV:  30 ml  Impression Exercise Capacity:  Excellent exercise capacity. BP Response:  Normal blood pressure response. Clinical Symptoms:  No significant symptoms noted. ECG Impression:  No significant ST segment change suggestive of ischemia. Comparison with Prior Nuclear Study: No previous nuclear study performed  Overall Impression:  Normal stress nuclear study.  LV Ejection Fraction: 63%.  LV Wall Motion:  NL LV Function; NL Wall Motion.  PhilThayer Headings.,Brooke BonitoD, FACCGlobal Rehab Rehabilitation Hospital8/2014, 5:10 PM Office - [phone]

## 2012-09-08 ENCOUNTER — Other Ambulatory Visit: Payer: Self-pay | Admitting: Cardiology

## 2012-09-24 ENCOUNTER — Other Ambulatory Visit: Payer: Self-pay | Admitting: Internal Medicine

## 2012-11-03 ENCOUNTER — Other Ambulatory Visit: Payer: Self-pay | Admitting: Cardiology

## 2012-12-16 ENCOUNTER — Other Ambulatory Visit: Payer: Self-pay

## 2013-01-04 ENCOUNTER — Ambulatory Visit: Payer: 59 | Admitting: Internal Medicine

## 2013-01-05 ENCOUNTER — Ambulatory Visit (INDEPENDENT_AMBULATORY_CARE_PROVIDER_SITE_OTHER): Payer: 59

## 2013-01-05 DIAGNOSIS — Z23 Encounter for immunization: Secondary | ICD-10-CM

## 2013-01-19 ENCOUNTER — Other Ambulatory Visit: Payer: Self-pay | Admitting: Internal Medicine

## 2013-03-16 ENCOUNTER — Other Ambulatory Visit: Payer: Self-pay | Admitting: Internal Medicine

## 2013-03-24 ENCOUNTER — Telehealth: Payer: Self-pay | Admitting: Internal Medicine

## 2013-03-24 MED ORDER — PANTOPRAZOLE SODIUM 40 MG PO TBEC: DELAYED_RELEASE_TABLET | ORAL | Status: DC

## 2013-03-24 NOTE — Telephone Encounter (Signed)
Pt request refill of pantoprazole (PROTONIX) 40 MG tablet [facility]/ [place] Pt has made his needed appt for march 11. Can you refill 30 days until then?

## 2013-03-24 NOTE — Telephone Encounter (Signed)
rx sent in electronically 

## 2013-04-20 ENCOUNTER — Ambulatory Visit: Payer: 59 | Admitting: Internal Medicine

## 2013-04-27 ENCOUNTER — Ambulatory Visit (INDEPENDENT_AMBULATORY_CARE_PROVIDER_SITE_OTHER): Payer: 59 | Admitting: Internal Medicine

## 2013-04-27 ENCOUNTER — Ambulatory Visit (INDEPENDENT_AMBULATORY_CARE_PROVIDER_SITE_OTHER)
Admission: RE | Admit: 2013-04-27 | Discharge: 2013-04-27 | Disposition: A | Discharge status: Home / Self Care | Payer: 59 | Source: Ambulatory Visit | Attending: Internal Medicine | Admitting: Internal Medicine

## 2013-04-27 ENCOUNTER — Encounter: Payer: Self-pay | Admitting: Internal Medicine

## 2013-04-27 VITALS — BP 112/70 | HR 76 | Temp 97.9°F | Ht 70.0 in | Wt 166.0 lb

## 2013-04-27 DIAGNOSIS — I10 Essential (primary) hypertension: Secondary | ICD-10-CM

## 2013-04-27 DIAGNOSIS — E785 Hyperlipidemia, unspecified: Secondary | ICD-10-CM

## 2013-04-27 DIAGNOSIS — I25810 Atherosclerosis of coronary artery bypass graft(s) without angina pectoris: Secondary | ICD-10-CM

## 2013-04-27 DIAGNOSIS — K219 Gastro-esophageal reflux disease without esophagitis: Secondary | ICD-10-CM

## 2013-04-27 DIAGNOSIS — Z0000 Encounter for general adult medical examination without abnormal findings: Secondary | ICD-10-CM

## 2013-04-27 DIAGNOSIS — K900 Celiac disease: Secondary | ICD-10-CM

## 2013-04-27 LAB — CBC WITH DIFFERENTIAL/PLATELET
Basophils Absolute: 0 10*3/uL (ref 0.0–0.1)
Basophils Relative: 0.6 % (ref 0.0–3.0)
EOS PCT: 2.1 % (ref 0.0–5.0)
Eosinophils Absolute: 0.1 10*3/uL (ref 0.0–0.7)
HCT: 42.5 % (ref 39.0–52.0)
Hemoglobin: 14.4 g/dL (ref 13.0–17.0)
Lymphocytes Relative: 23.4 % (ref 12.0–46.0)
Lymphs Abs: 1.4 10*3/uL (ref 0.7–4.0)
MCHC: 33.8 g/dL (ref 30.0–36.0)
MCV: 90.8 fl (ref 78.0–100.0)
Monocytes Absolute: 0.4 10*3/uL (ref 0.1–1.0)
Monocytes Relative: 7.3 % (ref 3.0–12.0)
NEUTROS PCT: 66.6 % (ref 43.0–77.0)
Neutro Abs: 4 10*3/uL (ref 1.4–7.7)
Platelets: 182 10*3/uL (ref 150.0–400.0)
RBC: 4.69 Mil/uL (ref 4.22–5.81)
RDW: 13.5 % (ref 11.5–14.6)
WBC: 6 10*3/uL (ref 4.5–10.5)

## 2013-04-27 LAB — BASIC METABOLIC PANEL
BUN: 13 mg/dL (ref 6–23)
CHLORIDE: 105 meq/L (ref 96–112)
CO2: 27 mEq/L (ref 19–32)
Calcium: 9.1 mg/dL (ref 8.4–10.5)
Creatinine, Ser: 1 mg/dL (ref 0.4–1.5)
GFR: 81.99 mL/min (ref >60.00)
GLUCOSE: 76 mg/dL (ref 70–99)
POTASSIUM: 4.2 meq/L (ref 3.5–5.1)
Sodium: 139 mEq/L (ref 135–145)

## 2013-04-27 LAB — TSH: TSH: 2.91 u[IU]/mL (ref 0.35–5.50)

## 2013-04-27 LAB — HEPATIC FUNCTION PANEL
ALK PHOS: 64 U/L (ref 39–117)
ALT: 21 U/L (ref 0–53)
AST: 23 U/L (ref 0–37)
Albumin: 4.3 g/dL (ref 3.5–5.2)
BILIRUBIN DIRECT: 0.1 mg/dL (ref 0.0–0.3)
Total Bilirubin: 0.8 mg/dL (ref 0.3–1.2)
Total Protein: 7 g/dL (ref 6.0–8.3)

## 2013-04-27 LAB — LIPID PANEL
CHOL/HDL RATIO: 3
Cholesterol: 123 mg/dL (ref 0–200)
HDL: 43.9 mg/dL (ref >39.00)
LDL Cholesterol: 63 mg/dL (ref 0–99)
Triglycerides: 81 mg/dL (ref 0.0–149.0)
VLDL: 16.2 mg/dL (ref 0.0–40.0)

## 2013-04-27 LAB — PSA: PSA: 3.68 ng/mL (ref 0.10–4.00)

## 2013-04-27 MED ORDER — PANTOPRAZOLE SODIUM 40 MG PO TBEC: DELAYED_RELEASE_TABLET | ORAL | Status: DC

## 2013-04-27 NOTE — Progress Notes (Signed)
Cad. S/p CABG- no symptoms, tolerating meds  Celiac- no sxs on restrictive diet  Lipids- tolerating meds  htn- tolerating meds  GERD- has sxs off of protonix  Past Medical History  Diagnosis Date  . GERD (gastroesophageal reflux disease)   . Hyperlipidemia   . Insomnia   . CAD (coronary artery disease)   . Celiac sprue   . OTITIS EXTERNA, ACUTE, LEFT     History   Social History  . Marital Status: Married    Spouse Name: N/A    Number of Children: N/A  . Years of Education: N/A   Occupational History  . Not on file.   Social History Main Topics  . Smoking status: Never Smoker   . Smokeless tobacco: Never Used  . Alcohol Use: 0.5 oz/week    1 drink(s) per week  . Drug Use: No  . Sexual Activity: Not on file   Other Topics Concern  . Not on file   Social History Narrative  . No narrative on file    Past Surgical History  Procedure Laterality Date  . Coronary artery bypass graft    . Appendectomy    . Tonsilectomy, adenoidectomy, bilateral myringotomy and tubes    . Shoulder surgery      left  . Nasal septoplasty w/ turbinoplasty      Family History  Problem Relation Age of Onset  . Hypertension Mother   . Heart attack Mother   . Heart disease Father     CABG  . Parkinsonism Father   . Dementia Father   . Hypertension Father   . Hemochromatosis Brother     Allergies  Allergen Reactions  . Epipen [Epinephrine Hcl]     syncope    Current Outpatient Prescriptions on File Prior to Visit  Medication Sig Dispense Refill  . aspirin 325 MG tablet Take 325 mg by mouth daily.        Marland Kitchen atorvastatin (LIPITOR) 20 MG tablet take 1 tablet by mouth at bedtime  30 tablet  12  . calcium citrate-vitamin D (CITRACAL+D) 315-200 MG-UNIT per tablet 2 tabs po am and 1 tab pm      . Ferrous Sulfate Dried (IRON) 160 (50 FE) MG TBCR Take 1 tablet by mouth daily.       . fish oil-omega-3 fatty acids 1000 MG capsule Take 1 g by mouth 2 (two) times daily. 1/2 tab po qd       . lisinopril (PRINIVIL,ZESTRIL) 5 MG tablet take 1 tablet by mouth once daily  30 tablet  12  . Multiple Vitamin (MULTIVITAMIN) tablet Take 3 tablets by mouth daily.       . niacin 250 MG tablet Take 250 mg by mouth 2 (two) times daily with a meal.       . vitamin A 8000 UNIT capsule Take 8,000 Units by mouth daily.         Current Facility-Administered Medications on File Prior to Visit  Medication Dose Route Frequency Provider Last Rate Last Dose  . 0.9 %  sodium chloride infusion  500 mL Intravenous Continuous Irene Shipper, MD         patient denies chest pain, shortness of breath, orthopnea. Denies lower extremity edema, abdominal pain, change in appetite, change in bowel movements. Patient denies rashes, musculoskeletal complaints. No other specific complaints in a complete review of systems.   Reviewed vitals  well-developed well-nourished male in no acute distress. HEENT exam atraumatic, normocephalic, neck supple without jugular venous  distention. Chest clear to auscultation cardiac exam S1-S2 are regular. Abdominal exam overweight with bowel sounds, soft and nontender. Extremities no edema. Neurologic exam is alert with a normal gait.  CAD, ARTERY BYPASS GRAFT He continues to take remarkable care of himself He is compliant with medications He is compliant with a fabulous exercise program  Continue same

## 2013-04-27 NOTE — Progress Notes (Signed)
Pre visit review using our clinic review tool, if applicable. No additional management support is needed unless otherwise documented below in the visit note. 

## 2013-04-28 ENCOUNTER — Telehealth: Payer: Self-pay | Admitting: Internal Medicine

## 2013-04-28 NOTE — Telephone Encounter (Signed)
Relevant patient education assigned to patient using Emmi. ° °

## 2013-05-02 NOTE — Assessment & Plan Note (Signed)
He continues to take remarkable care of himself He is compliant with medications He is compliant with a fabulous exercise program  Continue same

## 2013-09-09 ENCOUNTER — Other Ambulatory Visit: Payer: Self-pay | Admitting: Cardiology

## 2013-09-16 ENCOUNTER — Telehealth: Payer: Self-pay | Admitting: Cardiology

## 2013-09-16 DIAGNOSIS — Z79899 Other long term (current) drug therapy: Secondary | ICD-10-CM

## 2013-09-16 DIAGNOSIS — E785 Hyperlipidemia, unspecified: Secondary | ICD-10-CM

## 2013-09-16 NOTE — Telephone Encounter (Signed)
Labs ordered (per previous OV hepatic and lipid panels) - patient aware. Reminded him of new location

## 2013-09-16 NOTE — Telephone Encounter (Signed)
Is asking for a lab order to get lab work done before his appt on 09/23/13 .Marland Kitchen Please call

## 2013-09-20 ENCOUNTER — Telehealth: Payer: Self-pay | Admitting: Cardiology

## 2013-09-20 ENCOUNTER — Other Ambulatory Visit: Payer: Self-pay | Admitting: Cardiology

## 2013-09-20 LAB — HEPATIC FUNCTION PANEL
ALT: 17 U/L (ref 0–53)
AST: 22 U/L (ref 0–37)
Albumin: 4.6 g/dL (ref 3.5–5.2)
Alkaline Phosphatase: 59 U/L (ref 39–117)
BILIRUBIN TOTAL: 0.7 mg/dL (ref 0.2–1.2)
Bilirubin, Direct: 0.2 mg/dL (ref 0.0–0.3)
Indirect Bilirubin: 0.5 mg/dL (ref 0.2–1.2)
Total Protein: 7.1 g/dL (ref 6.0–8.3)

## 2013-09-20 LAB — LIPID PANEL
CHOL/HDL RATIO: 2.5 ratio
CHOLESTEROL: 124 mg/dL (ref 0–200)
HDL: 50 mg/dL (ref >39)
LDL Cholesterol: 56 mg/dL (ref 0–99)
Triglycerides: 88 mg/dL (ref <150)
VLDL: 18 mg/dL (ref 0–40)

## 2013-09-20 NOTE — Telephone Encounter (Signed)
New Prob    Pt is requesting's orders to have ferratin test done. Please call.

## 2013-09-21 LAB — FERRITIN: Ferritin: 208 ng/mL (ref 22–322)

## 2013-09-21 NOTE — Telephone Encounter (Signed)
Spoke with [facility] and they are going to add a ferritin to the lab work from yesterday. Left message for pt labs will be added.

## 2013-09-23 ENCOUNTER — Encounter: Payer: Self-pay | Admitting: Cardiology

## 2013-09-23 ENCOUNTER — Ambulatory Visit (INDEPENDENT_AMBULATORY_CARE_PROVIDER_SITE_OTHER): Payer: 59 | Admitting: Cardiology

## 2013-09-23 VITALS — BP 110/80 | HR 77 | Ht 70.0 in | Wt 161.2 lb

## 2013-09-23 DIAGNOSIS — I25810 Atherosclerosis of coronary artery bypass graft(s) without angina pectoris: Secondary | ICD-10-CM

## 2013-09-23 DIAGNOSIS — E785 Hyperlipidemia, unspecified: Secondary | ICD-10-CM

## 2013-09-23 DIAGNOSIS — I10 Essential (primary) hypertension: Secondary | ICD-10-CM

## 2013-09-23 NOTE — Patient Instructions (Signed)
Your physician wants you to follow-up in: [place] will receive a reminder letter in the mail two months in advance. If you don't receive a letter, please call our office to schedule the follow-up appointment.   STOP LISINOPRIL

## 2013-09-23 NOTE — Assessment & Plan Note (Signed)
Continue statin. 

## 2013-09-23 NOTE — Assessment & Plan Note (Addendum)
Continue aspirin and statin. Patient feels his blood pressure will be controlled with no medications. Discontinue lisinopril.

## 2013-09-23 NOTE — Assessment & Plan Note (Deleted)
Patient feels as though his blood pressure will be controlled on no medications. Discontinue lisinopril and follow blood pressure.

## 2013-09-23 NOTE — Progress Notes (Signed)
HPI:  FU coronary artery disease status post coronary bypassing graft in January 2009. Note at that time, he had a LIMA to the LAD, free RIMA to the obtuse marginal, and sequential saphenous vein graft to the first and second diagonals, as well as saphenous vein graft to the PDA. Note preoperative carotid Dopplers performed revealed no significant right or left internal carotid artery stenosis. Nuclear study 5/14 showed EF 63 with normal perfusion. Since I last saw him, the patient denies any dyspnea on exertion, orthopnea, PND, pedal edema, palpitations, syncope or chest pain.    Current Outpatient Prescriptions  Medication Sig Dispense Refill  . aspirin 325 MG tablet Take 325 mg by mouth daily.        Marland Kitchen atorvastatin (LIPITOR) 20 MG tablet take 1 tablet by mouth at bedtime  30 tablet  0  . calcium citrate-vitamin D (CITRACAL+D) 315-200 MG-UNIT per tablet 2 tabs po am and 1 tab pm      . Cholecalciferol (VITAMIN D3) 2000 UNITS TABS Take 1 tablet by mouth 2 (two) times daily.      . Ferrous Sulfate Dried (IRON) 160 (50 FE) MG TBCR Take 1 tablet by mouth daily.       Marland Kitchen lisinopril (PRINIVIL,ZESTRIL) 5 MG tablet take 1 tablet by mouth once daily  30 tablet  12  . magnesium oxide (MAG-OX) 400 MG tablet Take 400 mg by mouth 2 (two) times daily.      . Multiple Vitamin (MULTIVITAMIN) tablet Take 2 tablets by mouth 2 (two) times daily.       . Omega-3 Fatty Acids (OMEGA 3 PO) Take 2 capsules (3000 mg total) by mouth in the morning and take 1 capsule (1500 mg total) by mouth in the evening.      . pantoprazole (PROTONIX) 40 MG tablet take 1 tablet by mouth once daily  90 tablet  3  . vitamin A 10000 UNIT capsule Take 10,000 Units by mouth daily.      . vitamin B-12 (CYANOCOBALAMIN) 500 MCG tablet Take 500 mcg by mouth daily.       Current Facility-Administered Medications  Medication Dose Route Frequency Provider Last Rate Last Dose  . 0.9 %  sodium chloride infusion  500 mL Intravenous  Continuous Irene Shipper, MD         Past Medical History  Diagnosis Date  . GERD (gastroesophageal reflux disease)   . Hyperlipidemia   . Insomnia   . CAD (coronary artery disease)   . Celiac sprue   . OTITIS EXTERNA, ACUTE, LEFT     Past Surgical History  Procedure Laterality Date  . Coronary artery bypass graft    . Appendectomy    . Tonsilectomy, adenoidectomy, bilateral myringotomy and tubes    . Shoulder surgery      left  . Nasal septoplasty w/ turbinoplasty      History   Social History  . Marital Status: Married    Spouse Name: N/A    Number of Children: N/A  . Years of Education: N/A   Occupational History  . Not on file.   Social History Main Topics  . Smoking status: Never Smoker   . Smokeless tobacco: Never Used  . Alcohol Use: 0.5 oz/week    1 drink(s) per week  . Drug Use: No  . Sexual Activity: Not on file   Other Topics Concern  . Not on file   Social History Narrative  . No narrative on file  ROS: no fevers or chills, productive cough, hemoptysis, dysphasia, odynophagia, melena, hematochezia, dysuria, hematuria, rash, seizure activity, orthopnea, PND, pedal edema, claudication. Remaining systems are negative.  Physical Exam: Well-developed well-nourished in no acute distress.  Skin is warm and dry.  HEENT is normal.  Neck is supple.  Chest is clear to auscultation with normal expansion.  Cardiovascular exam is regular rate and rhythm.  Abdominal exam nontender or distended. No masses palpated. Extremities show no edema. neuro grossly intact  ECG Sinus rhythm at a rate of 77. First degree AV block. Left ventricular hypertrophy. Incomplete right bundle branch block.

## 2013-10-18 ENCOUNTER — Other Ambulatory Visit: Payer: Self-pay | Admitting: Cardiology

## 2013-10-18 NOTE — Telephone Encounter (Signed)
Pt need a new prescription for his generic Lipitor 20 mg #30.Please call to [place].

## 2013-11-24 ENCOUNTER — Ambulatory Visit (INDEPENDENT_AMBULATORY_CARE_PROVIDER_SITE_OTHER): Payer: 59

## 2013-11-24 DIAGNOSIS — Z23 Encounter for immunization: Secondary | ICD-10-CM

## 2014-02-23 ENCOUNTER — Ambulatory Visit: Payer: 59 | Admitting: Family Medicine

## 2014-04-03 ENCOUNTER — Encounter: Payer: Self-pay | Admitting: Internal Medicine

## 2014-04-21 ENCOUNTER — Other Ambulatory Visit: Payer: Self-pay | Admitting: Internal Medicine

## 2014-04-21 ENCOUNTER — Other Ambulatory Visit: Payer: Self-pay | Admitting: Cardiology

## 2014-04-21 NOTE — Telephone Encounter (Signed)
Rx has been sent to the pharmacy electronically. ° °

## 2014-05-16 ENCOUNTER — Ambulatory Visit: Payer: Self-pay | Admitting: Family Medicine

## 2014-06-17 ENCOUNTER — Other Ambulatory Visit: Payer: Self-pay | Admitting: Internal Medicine

## 2014-08-10 ENCOUNTER — Telehealth: Payer: Self-pay | Admitting: Family Medicine

## 2014-08-10 MED ORDER — PANTOPRAZOLE SODIUM 40 MG PO TBEC: 40.0000 mg | DELAYED_RELEASE_TABLET | Freq: Every day | ORAL | Status: DC

## 2014-08-10 NOTE — Telephone Encounter (Signed)
Pt request refill pantoprazole (PROTONIX) 40 MG tablet Pt has appt 10/31  Pt not seen since 04/2013 pls advise [facility] [place]

## 2014-08-10 NOTE — Telephone Encounter (Signed)
Medication refilled

## 2014-10-11 ENCOUNTER — Other Ambulatory Visit: Payer: Self-pay | Admitting: Cardiology

## 2014-12-06 ENCOUNTER — Other Ambulatory Visit: Payer: Self-pay | Admitting: Cardiology

## 2014-12-11 ENCOUNTER — Ambulatory Visit (INDEPENDENT_AMBULATORY_CARE_PROVIDER_SITE_OTHER): Payer: 59 | Admitting: Family Medicine

## 2014-12-11 ENCOUNTER — Encounter: Payer: Self-pay | Admitting: Family Medicine

## 2014-12-11 VITALS — BP 124/80 | HR 72 | Temp 98.5°F | Wt 160.0 lb

## 2014-12-11 DIAGNOSIS — C4491 Basal cell carcinoma of skin, unspecified: Secondary | ICD-10-CM | POA: Insufficient documentation

## 2014-12-11 DIAGNOSIS — K219 Gastro-esophageal reflux disease without esophagitis: Secondary | ICD-10-CM

## 2014-12-11 DIAGNOSIS — I25810 Atherosclerosis of coronary artery bypass graft(s) without angina pectoris: Secondary | ICD-10-CM

## 2014-12-11 DIAGNOSIS — E785 Hyperlipidemia, unspecified: Secondary | ICD-10-CM

## 2014-12-11 DIAGNOSIS — K900 Celiac disease: Secondary | ICD-10-CM

## 2014-12-11 NOTE — Assessment & Plan Note (Signed)
S:Cabg January 2009. 5 vessel. Atorvastatin 26m, Aspirin. [facility] Nuclear study 06/2002 with low risk and EF 63%.  .A/P: continue current rx. Encouraged patient to call to schedule cardiology follow up

## 2014-12-11 NOTE — Assessment & Plan Note (Signed)
S: Diagnosed 2012-Avoids gluten and controlled. Noted by low ferritin which improved avoiding gluten A/P: avoid gluten. Consider ferritin with future labs

## 2014-12-11 NOTE — Progress Notes (Signed)
Rodney Reddish, MD Phone: [phone]  Subjective:  Patient presents today to establish care with me as their new primary care provider. Patient was formerly a patient of [name]. Chief complaint-noted.   See problem oriented charting ROS-No chest pain or shortness of breath. No headache or blurry vision.   The following were reviewed and entered/updated in epic: Past Medical History  Diagnosis Date  . GERD (gastroesophageal reflux disease)   . Hyperlipidemia   . CAD (coronary artery disease)   . Celiac sprue   . OTITIS EXTERNA, ACUTE, LEFT   . Low back pain     in 1980s back would "go out" about once a year. got better after brace in 1985.   Patient Active Problem List   Diagnosis Date Noted  . 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 CAD, ARTERY BYPASS GRAFT 01/20/2008    Priority: High  . Hypertension 04/27/2013    Priority: Medium  . Hyperlipidemia 12/24/2006    Priority: Medium  . Basal cell carcinoma 12/11/2014    Priority: Low  . GERD 12/24/2006    Priority: Low   Past Surgical History  Procedure Laterality Date  . Coronary artery bypass graft    . Appendectomy    . Tonsilectomy, adenoidectomy, bilateral myringotomy and tubes    . Shoulder surgery      left  . Nasal septoplasty w/ turbinoplasty      deviated septum    Family History  Problem Relation Age of Onset  . Hypertension Mother   . Heart attack Mother   . Heart disease Father     CABG, uncles as well  . Parkinson's disease Father     dementia potentially from parkinsons  . Hypertension Father   . Hemochromatosis Brother     Medications- reviewed and updated Current Outpatient Prescriptions  Medication Sig Dispense Refill  . aspirin 325 MG tablet Take 325 mg by mouth daily.      Marland Kitchen atorvastatin (LIPITOR) 20 MG tablet take 1 tablet by mouth at bedtime 30 tablet 0  . calcium citrate-vitamin D (CITRACAL+D) 315-200 MG-UNIT per tablet 2 tabs po am and 1 tab pm    . Cholecalciferol  (VITAMIN D3) 2000 UNITS TABS Take 1 tablet by mouth 2 (two) times daily.    . Ferrous Sulfate Dried (IRON) 160 (50 FE) MG TBCR Take 1 tablet by mouth daily.     . magnesium oxide (MAG-OX) 400 MG tablet Take 400 mg by mouth 2 (two) times daily.    . Multiple Vitamin (MULTIVITAMIN) tablet Take 2 tablets by mouth 2 (two) times daily.     . Omega-3 Fatty Acids (OMEGA 3 PO) Take 2 capsules (3000 mg total) by mouth in the morning and take 1 capsule (1500 mg total) by mouth in the evening.    . pantoprazole (PROTONIX) 40 MG tablet Take 1 tablet (40 mg total) by mouth daily. 30 tablet 3  . vitamin A 10000 UNIT capsule Take 10,000 Units by mouth daily.    . vitamin B-12 (CYANOCOBALAMIN) 500 MCG tablet Take 500 mcg by mouth daily.     No current facility-administered medications for this visit.    Allergies-reviewed and updated Allergies  Allergen Reactions  . Epipen [Epinephrine Hcl]     syncope    Social History   Social History  . Marital Status: Married    Spouse Name: N/A  . Number of Children: N/A  . Years of Education: N/A   Social History Main Topics  .  Smoking status: Never Smoker   . Smokeless tobacco: Never Used  . Alcohol Use: 0.5 oz/week    1 drink(s) per week  . Drug Use: No  . Sexual Activity: Not Asked   Other Topics Concern  . None   Social History Narrative   Married 44 years in 2016. No children. 8 dogs- chihuahua and mixes of chihuahua      [employer]. 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 Minimum 74.       Hobbies: time with wife and dogs   Exercise in evenings- walk or run (limits due to knees), exercise bike and treadmill- 5x a week          ROS--See HPI   Objective: BP 124/80 mmHg  Pulse 72  Temp(Src) 98.5 F (36.9 C)  Wt 160 lb (72.576 kg) Gen: NAD, resting comfortably HEENT: Mucous membranes are moist. Oropharynx  normal CV: RRR no murmurs rubs or gallops Lungs: CTAB no crackles, wheeze, rhonchi Abdomen: soft/nontender/nondistended/normal bowel sounds. No rebound or guarding.  Ext: no edema, 2+ PT pulses Skin: warm, dry, no rash Neuro: grossly normal, moves all extremities, PERRLA  Assessment/Plan:  Celiac sprue S: Diagnosed 2012-Avoids gluten and controlled. Noted by low ferritin which improved avoiding gluten A/P: avoid gluten. Consider ferritin with future labs   CAD, ARTERY BYPASS GRAFT S:Cabg January 2009. 5 vessel. Atorvastatin 43m, Aspirin. [facility] Nuclear study 06/2002 with low risk and EF 63%.  .A/P: continue current rx. Encouraged patient to call to schedule cardiology follow up   Hyperlipidemia S: controlled on atorvastatin 262mLab Results  Component Value Date   CHOL 124 09/20/2013   HDL 50 09/20/2013   LDLCALC 56 09/20/2013   TRIG 88 09/20/2013   CHOLHDL 2.5 09/20/2013  A/P: LDL at goal <70. Will get updated lipids at CPE- scheduling next available   GERD S: has trialed off and gets tightening sensation in his neck (gluobus). Resolves on protonix. Currently protonix 4063mvery other day - doing well A/P: if continues to do well, could consider 67m82mse in future   next available CPE

## 2014-12-11 NOTE — Assessment & Plan Note (Signed)
S: controlled on atorvastatin 60m Lab Results  Component Value Date   CHOL 124 09/20/2013   HDL 50 09/20/2013   LDLCALC 56 09/20/2013   TRIG 88 09/20/2013   CHOLHDL 2.5 09/20/2013  A/P: LDL at goal <70. Will get updated lipids at CPE- scheduling next available

## 2014-12-11 NOTE — Patient Instructions (Signed)
Overall things look great  Need to get that follow up colonoscopy  Schedule next available physical. For front desk- ok to use any 30 minute slot not just 8:15, 8:45, 1:15, 1:45.  Schedule a lab visit at the front desk within a week before your physical- make sure to tell lab you want a PSA. Return for future fasting labs. Nothing but water after midnight please.

## 2014-12-11 NOTE — Assessment & Plan Note (Signed)
S: has trialed off and gets tightening sensation in his neck (gluobus). Resolves on protonix. Currently protonix 57m every other day - doing well A/P: if continues to do well, could consider 249mdose in future

## 2014-12-28 ENCOUNTER — Ambulatory Visit (INDEPENDENT_AMBULATORY_CARE_PROVIDER_SITE_OTHER): Payer: 59 | Admitting: Family Medicine

## 2014-12-28 DIAGNOSIS — Z23 Encounter for immunization: Secondary | ICD-10-CM | POA: Diagnosis not present

## 2015-01-12 ENCOUNTER — Other Ambulatory Visit (INDEPENDENT_AMBULATORY_CARE_PROVIDER_SITE_OTHER): Payer: 59

## 2015-01-12 DIAGNOSIS — Z0000 Encounter for general adult medical examination without abnormal findings: Secondary | ICD-10-CM

## 2015-01-12 LAB — POCT URINALYSIS DIPSTICK
BILIRUBIN UA: NEGATIVE
GLUCOSE UA: NEGATIVE
KETONES UA: NEGATIVE
LEUKOCYTES UA: NEGATIVE
NITRITE UA: NEGATIVE
Protein, UA: NEGATIVE
RBC UA: NEGATIVE
Spec Grav, UA: 1.02
Urobilinogen, UA: 0.2
pH, UA: 6

## 2015-01-12 LAB — CBC WITH DIFFERENTIAL/PLATELET
BASOS ABS: 0 10*3/uL (ref 0.0–0.1)
Basophils Relative: 0.4 % (ref 0.0–3.0)
EOS ABS: 0.2 10*3/uL (ref 0.0–0.7)
Eosinophils Relative: 2.3 % (ref 0.0–5.0)
HCT: 43.9 % (ref 39.0–52.0)
HEMOGLOBIN: 14.6 g/dL (ref 13.0–17.0)
LYMPHS ABS: 1.6 10*3/uL (ref 0.7–4.0)
Lymphocytes Relative: 16.3 % (ref 12.0–46.0)
MCHC: 33.2 g/dL (ref 30.0–36.0)
MCV: 91.7 fl (ref 78.0–100.0)
MONO ABS: 0.8 10*3/uL (ref 0.1–1.0)
Monocytes Relative: 8 % (ref 3.0–12.0)
NEUTROS PCT: 73 % (ref 43.0–77.0)
Neutro Abs: 7.1 10*3/uL (ref 1.4–7.7)
Platelets: 221 10*3/uL (ref 150.0–400.0)
RBC: 4.79 Mil/uL (ref 4.22–5.81)
RDW: 13.7 % (ref 11.5–15.5)
WBC: 9.7 10*3/uL (ref 4.0–10.5)

## 2015-01-12 LAB — LIPID PANEL
CHOL/HDL RATIO: 3
CHOLESTEROL: 125 mg/dL (ref 0–200)
HDL: 40.8 mg/dL (ref >39.00)
LDL CALC: 62 mg/dL (ref 0–99)
NonHDL: 84.66
Triglycerides: 113 mg/dL (ref 0.0–149.0)
VLDL: 22.6 mg/dL (ref 0.0–40.0)

## 2015-01-12 LAB — BASIC METABOLIC PANEL
BUN: 12 mg/dL (ref 6–23)
CALCIUM: 9.3 mg/dL (ref 8.4–10.5)
CO2: 27 mEq/L (ref 19–32)
CREATININE: 0.96 mg/dL (ref 0.40–1.50)
Chloride: 105 mEq/L (ref 96–112)
GFR: 83.51 mL/min (ref >60.00)
GLUCOSE: 86 mg/dL (ref 70–99)
POTASSIUM: 4.5 meq/L (ref 3.5–5.1)
Sodium: 142 mEq/L (ref 135–145)

## 2015-01-12 LAB — PSA: PSA: 4.21 ng/mL — AB (ref 0.10–4.00)

## 2015-01-12 LAB — HEPATIC FUNCTION PANEL
ALT: 14 U/L (ref 0–53)
AST: 20 U/L (ref 0–37)
Albumin: 4.3 g/dL (ref 3.5–5.2)
Alkaline Phosphatase: 77 U/L (ref 39–117)
BILIRUBIN DIRECT: 0.1 mg/dL (ref 0.0–0.3)
BILIRUBIN TOTAL: 0.6 mg/dL (ref 0.2–1.2)
Total Protein: 7.4 g/dL (ref 6.0–8.3)

## 2015-01-12 LAB — TSH: TSH: 3.85 u[IU]/mL (ref 0.35–4.50)

## 2015-01-16 ENCOUNTER — Ambulatory Visit: Payer: 59 | Admitting: Cardiology

## 2015-01-18 NOTE — Progress Notes (Signed)
HPI: FU coronary artery disease status post coronary bypassing graft in January 2009. Note at that time, he had a LIMA to the LAD, free RIMA to the obtuse marginal, and sequential saphenous vein graft to the first and second diagonals, as well as saphenous vein graft to the PDA. Note preoperative carotid Dopplers performed revealed no significant right or left internal carotid artery stenosis. Nuclear study 5/14 showed EF 63 with normal perfusion. Since I last saw him, the patient denies any dyspnea on exertion, orthopnea, PND, pedal edema, palpitations, syncope or chest pain.   Current Outpatient Prescriptions  Medication Sig Dispense Refill  . aspirin 325 MG tablet Take 325 mg by mouth daily.      Marland Kitchen atorvastatin (LIPITOR) 20 MG tablet take 1 tablet by mouth at bedtime 30 tablet 0  . calcium citrate-vitamin D (CITRACAL+D) 315-200 MG-UNIT per tablet 2 tabs po am and 1 tab pm    . Cholecalciferol (VITAMIN D3) 2000 UNITS TABS Take 1 tablet by mouth 2 (two) times daily.    . Ferrous Sulfate Dried (IRON) 160 (50 FE) MG TBCR Take 1 tablet by mouth daily.     . magnesium oxide (MAG-OX) 400 MG tablet Take 400 mg by mouth 2 (two) times daily.    . Multiple Vitamin (MULTIVITAMIN) tablet Take 2 tablets by mouth 2 (two) times daily.     . Omega-3 Fatty Acids (OMEGA 3 PO) Take 2 capsules (3000 mg total) by mouth in the morning and take 1 capsule (1500 mg total) by mouth in the evening.    . pantoprazole (PROTONIX) 40 MG tablet Take 1 tablet (40 mg total) by mouth daily. (Patient taking differently: Take 40 mg by mouth every other day. ) 30 tablet 3  . vitamin A 10000 UNIT capsule Take 10,000 Units by mouth daily.    . vitamin B-12 (CYANOCOBALAMIN) 500 MCG tablet Take 500 mcg by mouth daily.     No current facility-administered medications for this visit.     Past Medical History  Diagnosis Date  . GERD (gastroesophageal reflux disease)   . Hyperlipidemia   . CAD (coronary artery disease)   .  Celiac sprue   . OTITIS EXTERNA, ACUTE, LEFT   . Low back pain     in 1980s back would "go out" about once a year. got better after brace in 1985.    Past Surgical History  Procedure Laterality Date  . Coronary artery bypass graft    . Appendectomy    . Tonsilectomy, adenoidectomy, bilateral myringotomy and tubes    . Shoulder surgery      left  . Nasal septoplasty w/ turbinoplasty      deviated septum    Social History   Social History  . Marital Status: Married    Spouse Name: N/A  . Number of Children: N/A  . Years of Education: N/A   Occupational History  . Not on file.   Social History Main Topics  . Smoking status: Never Smoker   . Smokeless tobacco: Never Used  . Alcohol Use: 0.5 oz/week    1 drink(s) per week  . Drug Use: No  . Sexual Activity: Not on file   Other Topics Concern  . Not on file   Social History Narrative   Married 44 years in 2016. No children. 8 dogs- chihuahua and mixes of chihuahua      [employer]. 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 Minimum 74.       Hobbies: time with wife and dogs   Exercise in evenings- walk or run (limits due to knees), exercise bike and treadmill- 5x a week          ROS: no fevers or chills, productive cough, hemoptysis, dysphasia, odynophagia, melena, hematochezia, dysuria, hematuria, rash, seizure activity, orthopnea, PND, pedal edema, claudication. Remaining systems are negative.  Physical Exam: Well-developed well-nourished in no acute distress.  Skin is warm and dry.  HEENT is normal.  Neck is supple.  Chest is clear to auscultation with normal expansion.  Cardiovascular exam is regular rate and rhythm.  Abdominal exam nontender or distended. No masses palpated. Extremities show no edema. neuro grossly intact  ECG Sinus rhythm at a rate of 76. First-degree AV block.  Right bundle branch block.

## 2015-01-19 ENCOUNTER — Encounter: Payer: Self-pay | Admitting: Family Medicine

## 2015-01-19 ENCOUNTER — Ambulatory Visit (INDEPENDENT_AMBULATORY_CARE_PROVIDER_SITE_OTHER): Payer: 59 | Admitting: Family Medicine

## 2015-01-19 VITALS — BP 122/70 | Temp 97.8°F | Ht 70.0 in | Wt 164.0 lb

## 2015-01-19 DIAGNOSIS — Z23 Encounter for immunization: Secondary | ICD-10-CM | POA: Diagnosis not present

## 2015-01-19 DIAGNOSIS — R972 Elevated prostate specific antigen [PSA]: Secondary | ICD-10-CM

## 2015-01-19 DIAGNOSIS — Z1211 Encounter for screening for malignant neoplasm of colon: Secondary | ICD-10-CM

## 2015-01-19 DIAGNOSIS — Z0000 Encounter for general adult medical examination without abnormal findings: Secondary | ICD-10-CM

## 2015-01-19 LAB — PSA: PSA: 3.63 ng/mL (ref 0.10–4.00)

## 2015-01-19 NOTE — Addendum Note (Signed)
Addended by: Clyde Lundborg A on: 01/19/2015 03:57 PM   Modules accepted: Orders

## 2015-01-19 NOTE — Patient Instructions (Addendum)
Repeat PSA again today as was slightly elevated  Prevnar 13 before you go (1st pneumonia shot)  Consider 1/2 tab of protonix every other day at this point and see how you do. We may even try to switch to h2 blocker like zantac if you continue to do well  [facility] will call to schedule colonoscopy

## 2015-01-19 NOTE — Progress Notes (Signed)
Garret Reddish, MD Phone: [phone]  Subjective:  Patient presents today for their annual physical. Chief complaint-noted.   See problem oriented charting- ROS- full  review of systems was completed and negative except for: right forefoot pain controlled with current memory foam shoes, no myalgias. No chest pain or shortness of breath with activity  The following were reviewed and entered/updated in epic: Past Medical History  Diagnosis Date  . GERD (gastroesophageal reflux disease)   . Hyperlipidemia   . CAD (coronary artery disease)   . Celiac sprue   . OTITIS EXTERNA, ACUTE, LEFT   . Low back pain     in 1980s back would "go out" about once a year. got better after brace in 1985.   Patient Active Problem List   Diagnosis Date Noted  . 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 CAD, ARTERY BYPASS GRAFT 01/20/2008    Priority: High  . Hypertension 04/27/2013    Priority: Medium  . Hyperlipidemia 12/24/2006    Priority: Medium  . Basal cell carcinoma 12/11/2014    Priority: Low  . GERD 12/24/2006    Priority: Low   Past Surgical History  Procedure Laterality Date  . Coronary artery bypass graft    . Appendectomy    . Tonsilectomy, adenoidectomy, bilateral myringotomy and tubes    . Shoulder surgery      left  . Nasal septoplasty w/ turbinoplasty      deviated septum    Family History  Problem Relation Age of Onset  . Hypertension Mother   . Heart attack Mother   . Heart disease Father     CABG, uncles as well  . Parkinson's disease Father     dementia potentially from parkinsons  . Hypertension Father   . Hemochromatosis Brother     Medications- reviewed and updated Current Outpatient Prescriptions  Medication Sig Dispense Refill  . aspirin 325 MG tablet Take 325 mg by mouth daily.      Marland Kitchen atorvastatin (LIPITOR) 20 MG tablet take 1 tablet by mouth at bedtime 30 tablet 0  . calcium citrate-vitamin D (CITRACAL+D) 315-200 MG-UNIT per tablet 2 tabs po  am and 1 tab pm    . Cholecalciferol (VITAMIN D3) 2000 UNITS TABS Take 1 tablet by mouth 2 (two) times daily.    . Ferrous Sulfate Dried (IRON) 160 (50 FE) MG TBCR Take 1 tablet by mouth daily.     . magnesium oxide (MAG-OX) 400 MG tablet Take 400 mg by mouth 2 (two) times daily.    . Multiple Vitamin (MULTIVITAMIN) tablet Take 2 tablets by mouth 2 (two) times daily.     . Omega-3 Fatty Acids (OMEGA 3 PO) Take 2 capsules (3000 mg total) by mouth in the morning and take 1 capsule (1500 mg total) by mouth in the evening.    . pantoprazole (PROTONIX) 40 MG tablet Take 1 tablet (40 mg total) by mouth daily. 30 tablet 3  . vitamin A 10000 UNIT capsule Take 10,000 Units by mouth daily.    . vitamin B-12 (CYANOCOBALAMIN) 500 MCG tablet Take 500 mcg by mouth daily.     Allergies-reviewed and updated Allergies  Allergen Reactions  . Epipen [Epinephrine Hcl]     syncope    Social History   Social History  . Marital Status: Married    Spouse Name: N/A  . Number of Children: N/A  . Years of Education: N/A   Social History Main Topics  . Smoking status: Never Smoker   .  Smokeless tobacco: Never Used  . Alcohol Use: 0.5 oz/week    1 drink(s) per week  . Drug Use: No  . Sexual Activity: Not Asked   Other Topics Concern  . None   Social History Narrative   Married 44 years in 2016. No children. 8 dogs- chihuahua and mixes of chihuahua      [employer]. 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 Minimum 74.       Hobbies: time with wife and dogs   Exercise in evenings- walk or run (limits due to knees), exercise bike and treadmill- 5x a week         ROS--See HPI   Objective: BP 122/70 mmHg  Temp(Src) 97.8 F (36.6 C)  Ht 5' 10"  (1.778 m)  Wt 164 lb (74.39 kg)  BMI 23.53 kg/m2 Gen: NAD, resting comfortably HEENT: Mucous membranes are moist. Oropharynx  normal Neck: no thyromegaly CV: RRR no murmurs rubs or gallops Lungs: CTAB no crackles, wheeze, rhonchi Abdomen: soft/nontender/nondistended/normal bowel sounds. No rebound or guarding.  Rectal: normal tone, diffusely enlarged prostate, no masses or tenderness Ext: no edema Skin: warm, dry Neuro: grossly normal, moves all extremities, PERRLA  Assessment/Plan:  64 y.o. male presenting for annual physical.  Health Maintenance counseling: 1. Anticipatory guidance: Patient counseled regarding regular dental exams, eye exams, wearing seatbelts.  2. Risk factor reduction:  Advised patient of need for regular exercise and diet rich and fruits and vegetables to reduce risk of heart attack and stroke.  3. Immunizations/screenings/ancillary studies Health Maintenance Due  Topic Date Due  . COLONOSCOPY - 03/09/2009 with 5 year repeat advised- referred today 03/09/2014  . PNA vac Low Risk Adult (1 of 2 - PCV13)-prevnar today 11/18/2014   4. Prostate cancer screening- do lab then do rectal exam - suspect BPH related  Lab Results  Component Value Date   PSA 4.21* 01/12/2015   PSA 3.68 04/27/2013   PSA 3.66 05/27/2010   5. Colon cancer screening - 03/09/2009 with 5 year repeat advised- referred today 6. Skin cancer screening- follows with Dr. Wilhemina Bonito with history of basal cell  Home cuff appears to be at least 15-20 points higher than our readings and his prior home readings. Not on BP medications. CAD- has Dr. Stanford Breed follow up on Monday. Celiac controlled by avoiding gluten. Lipids at goal <70 with CAD.   Return precautions advised.   Orders Placed This Encounter  Procedures  . Ambulatory referral to Gastroenterology    Referral Priority:  Routine    Referral Type:  Consultation    Referral Reason:  Specialty Services Required    Number of Visits Requested:  1

## 2015-01-22 ENCOUNTER — Encounter: Payer: Self-pay | Admitting: Cardiology

## 2015-01-22 ENCOUNTER — Ambulatory Visit (INDEPENDENT_AMBULATORY_CARE_PROVIDER_SITE_OTHER): Payer: 59 | Admitting: Cardiology

## 2015-01-22 ENCOUNTER — Encounter: Payer: Self-pay | Admitting: Internal Medicine

## 2015-01-22 VITALS — BP 104/70 | HR 76 | Ht 70.0 in | Wt 160.0 lb

## 2015-01-22 DIAGNOSIS — I10 Essential (primary) hypertension: Secondary | ICD-10-CM | POA: Diagnosis not present

## 2015-01-22 DIAGNOSIS — Z79899 Other long term (current) drug therapy: Secondary | ICD-10-CM | POA: Diagnosis not present

## 2015-01-22 DIAGNOSIS — E785 Hyperlipidemia, unspecified: Secondary | ICD-10-CM | POA: Diagnosis not present

## 2015-01-22 DIAGNOSIS — I25700 Atherosclerosis of coronary artery bypass graft(s), unspecified, with unstable angina pectoris: Secondary | ICD-10-CM

## 2015-01-22 MED ORDER — ATORVASTATIN CALCIUM 80 MG PO TABS: 80.0000 mg | ORAL_TABLET | Freq: Every day | ORAL | Status: DC

## 2015-01-22 NOTE — Assessment & Plan Note (Addendum)
Change Lipitor to 80 mg daily. Check lipids and liver in 4 weeks.

## 2015-01-22 NOTE — Assessment & Plan Note (Signed)
Continue aspirin and statin. 

## 2015-01-22 NOTE — Patient Instructions (Addendum)
Medication Instructions:  INCREASE Atorvastatin to 80 mg - take 1 tablet by mouth daily  >>A new prescription has been sent to the pharmacy electronically.  Labwork: Your physician recommends that you return for lab work in [place].  Testing/Procedures: NONE  Follow-Up: Dr Stanford Breed recommends that you schedule a follow-up appointment in 1 year. You will receive a reminder letter in the mail two months in advance. If you don't receive a letter, please call our office to schedule the follow-up appointment.  If you need a refill on your cardiac medications before your next appointment, please call your pharmacy.

## 2015-03-16 ENCOUNTER — Encounter: Payer: Self-pay | Admitting: Gastroenterology

## 2015-03-23 ENCOUNTER — Ambulatory Visit (AMBULATORY_SURGERY_CENTER): Payer: Self-pay | Admitting: *Deleted

## 2015-03-23 VITALS — Ht 70.0 in | Wt 163.4 lb

## 2015-03-23 DIAGNOSIS — Z860100 Personal history of colon polyps, unspecified: Secondary | ICD-10-CM

## 2015-03-23 MED ORDER — NA SULFATE-K SULFATE-MG SULF 17.5-3.13-1.6 GM/177ML PO SOLN: ORAL | Status: DC

## 2015-03-23 NOTE — Progress Notes (Signed)
No allergies to eggs or soy. No problems with anesthesia.  Pt given Emmi instructions for colonoscopy  No oxygen use  No diet drug use  

## 2015-03-27 ENCOUNTER — Other Ambulatory Visit: Payer: Self-pay | Admitting: Family Medicine

## 2015-03-27 MED ORDER — PANTOPRAZOLE SODIUM 40 MG PO TBEC: 40.0000 mg | DELAYED_RELEASE_TABLET | Freq: Every day | ORAL | Status: DC

## 2015-04-03 ENCOUNTER — Telehealth: Payer: Self-pay | Admitting: Family Medicine

## 2015-04-03 NOTE — Telephone Encounter (Signed)
Yes, he will just need to schedule a lab appt.

## 2015-04-03 NOTE — Telephone Encounter (Signed)
Pt scheduled  

## 2015-04-03 NOTE — Telephone Encounter (Signed)
Pt call to say that Dr Stanford Breed ordered a lipid panel back in December and pt is asking to come here to have those labs drawn. Will this be ok?

## 2015-04-04 ENCOUNTER — Other Ambulatory Visit: Payer: 59

## 2015-04-05 ENCOUNTER — Other Ambulatory Visit (INDEPENDENT_AMBULATORY_CARE_PROVIDER_SITE_OTHER): Payer: 59

## 2015-04-05 DIAGNOSIS — K7200 Acute and subacute hepatic failure without coma: Secondary | ICD-10-CM

## 2015-04-05 DIAGNOSIS — E785 Hyperlipidemia, unspecified: Secondary | ICD-10-CM

## 2015-04-05 LAB — HEPATIC FUNCTION PANEL
ALBUMIN: 4.5 g/dL (ref 3.5–5.2)
ALK PHOS: 71 U/L (ref 39–117)
ALT: 19 U/L (ref 0–53)
AST: 27 U/L (ref 0–37)
Bilirubin, Direct: 0.1 mg/dL (ref 0.0–0.3)
TOTAL PROTEIN: 7.4 g/dL (ref 6.0–8.3)
Total Bilirubin: 0.7 mg/dL (ref 0.2–1.2)

## 2015-04-05 LAB — LIPID PANEL
CHOLESTEROL: 107 mg/dL (ref 0–200)
HDL: 47.2 mg/dL (ref >39.00)
LDL Cholesterol: 47 mg/dL (ref 0–99)
NonHDL: 59.34
TRIGLYCERIDES: 64 mg/dL (ref 0.0–149.0)
Total CHOL/HDL Ratio: 2
VLDL: 12.8 mg/dL (ref 0.0–40.0)

## 2015-04-06 ENCOUNTER — Encounter: Payer: Self-pay | Admitting: Internal Medicine

## 2015-04-06 ENCOUNTER — Ambulatory Visit (AMBULATORY_SURGERY_CENTER): Payer: 59 | Admitting: Internal Medicine

## 2015-04-06 VITALS — BP 120/74 | HR 71 | Temp 98.0°F | Resp 13 | Ht 70.0 in | Wt 163.0 lb

## 2015-04-06 DIAGNOSIS — Z860100 Personal history of colon polyps, unspecified: Secondary | ICD-10-CM

## 2015-04-06 DIAGNOSIS — D122 Benign neoplasm of ascending colon: Secondary | ICD-10-CM

## 2015-04-06 DIAGNOSIS — Z800 Family history of malignant neoplasm of digestive organs: Secondary | ICD-10-CM

## 2015-04-06 MED ORDER — SODIUM CHLORIDE 0.9 % IV SOLN: 500.0000 mL | INTRAVENOUS | Status: DC

## 2015-04-06 NOTE — Progress Notes (Signed)
Report to PACU, RN, vss, BBS= Clear.  

## 2015-04-06 NOTE — Progress Notes (Signed)
Called to room to assist during endoscopic procedure.  Patient ID and intended procedure confirmed with present staff. Received instructions for my participation in the procedure from the performing physician.  

## 2015-04-06 NOTE — Op Note (Signed)
[facility]  [address]. [place], [id number]   COLONOSCOPY PROCEDURE REPORT  PATIENT: [name], [name]  MR#: [id number] BIRTHDATE: 1951-01-05 , 31  yrs. old GENDER: male ENDOSCOPIST: Eustace Quail, MD REFERRED CC:[id number] Program Recall PROCEDURE DATE:  04/06/2015 PROCEDURE:   Colonoscopy, surveillance and Colonoscopy with snare polypectomy X1 First Screening Colonoscopy - Avg.  risk and is 50 yrs.  old or older - No.  Prior Negative Screening - Now for repeat screening. N/A  History of Adenoma - Now for follow-up colonoscopy & has been > or = to 3 yrs.  Yes hx of adenoma.  Has been 3 or more years since last colonoscopy.  Polyps removed today? Yes ASA CLASS:   Class II INDICATIONS:Surveillance due to prior colonic neoplasia, FH Colon or Rectal Adenocarcinoma, and PH Colon Adenoma.   . Previous examination February 2006 with tubular adenoma and January 2011 with small tubular adenomas. Now reports father with colon cancer history around age 42 MEDICATIONS: Monitored anesthesia care and Propofol 300 mg IV  DESCRIPTION OF PROCEDURE:   After the risks benefits and alternatives of the procedure were thoroughly explained, informed consent was obtained.  The digital rectal exam revealed no abnormalities of the rectum.   The LB [id number]  endoscope was introduced through the anus and advanced to the cecum, which was identified by both the appendix and ileocecal valve. No adverse events experienced.   The quality of the prep was excellent. (Suprep was used)  The instrument was then slowly withdrawn as the colon was fully examined. Estimated blood loss is zero unless otherwise noted in this procedure report.  COLON FINDINGS: A sessile polyp measuring 8 mm in size was found in the ascending colon.  A polypectomy was performed with a cold snare.  The resection was complete, the polyp tissue was completely retrieved and sent to histology.   The examination was  otherwise normal. There was acute angulation of the rectosigmoid region noted.  Retroflexed views revealed no abnormalities. The time to cecum = 4.6 Withdrawal time = 11.3   The scope was withdrawn and the procedure completed. COMPLICATIONS: There were no immediate complications.  ENDOSCOPIC IMPRESSION: 1.   Sessile polyp was found in the ascending colon; polypectomy was performed with a cold snare 2.   The examination was otherwise normal  RECOMMENDATIONS: 1. Follow up colonoscopy in 5 years  (pediatric scope)  eSigned:  Eustace Quail, MD 04/06/2015 11:53 AM   cc: The Patient and Marin Olp

## 2015-04-06 NOTE — Patient Instructions (Signed)
YOU HAD AN ENDOSCOPIC PROCEDURE TODAY AT [facility]:   Refer to the procedure report that was given to you for any specific questions about what was found during the examination.  If the procedure report does not answer your questions, please call your gastroenterologist to clarify.  If you requested that your care partner not be given the details of your procedure findings, then the procedure report has been included in a sealed envelope for you to review at your convenience later.  YOU SHOULD EXPECT: Some feelings of bloating in the abdomen. Passage of more gas than usual.  Walking can help get rid of the air that was put into your GI tract during the procedure and reduce the bloating. If you had a lower endoscopy (such as a colonoscopy or flexible sigmoidoscopy) you may notice spotting of blood in your stool or on the toilet paper. If you underwent a bowel prep for your procedure, you may not have a normal bowel movement for a few days.  Please Note:  You might notice some irritation and congestion in your nose or some drainage.  This is from the oxygen used during your procedure.  There is no need for concern and it should clear up in a day or so.  SYMPTOMS TO REPORT IMMEDIATELY:   Following lower endoscopy (colonoscopy or flexible sigmoidoscopy):  Excessive amounts of blood in the stool  Significant tenderness or worsening of abdominal pains  Swelling of the abdomen that is new, acute  Fever of 100F or higher  For urgent or emergent issues, a gastroenterologist can be reached at any hour by calling [phone].   DIET: Your first meal following the procedure should be a small meal and then it is ok to progress to your normal diet. Heavy or fried foods are harder to digest and may make you feel nauseous or bloated.  Likewise, meals heavy in dairy and vegetables can increase bloating.  Drink plenty of fluids but you should avoid alcoholic beverages for 24  hours.  ACTIVITY:  You should plan to take it easy for the rest of today and you should NOT DRIVE or use heavy machinery until tomorrow (because of the sedation medicines used during the test).    FOLLOW UP: Our staff will call the number listed on your records the next business day following your procedure to check on you and address any questions or concerns that you may have regarding the information given to you following your procedure. If we do not reach you, we will leave a message.  However, if you are feeling well and you are not experiencing any problems, there is no need to return our call.  We will assume that you have returned to your regular daily activities without incident.  If any biopsies were taken you will be contacted by phone or by letter within the next 1-3 weeks.  Please call us at [phone] if you have not heard about the biopsies in 3 weeks.   SIGNATURES/CONFIDENTIALITY: You and/or your care partner have signed paperwork which will be entered into your electronic medical record.  These signatures attest to the fact that that the information above on your After Visit Summary has been reviewed and is understood.  Full responsibility of the confidentiality of this discharge information lies with you and/or your care-partner.  Please continue your normal medications  Next colonoscopy in 5 years  Please read over handout about polyps

## 2015-04-09 ENCOUNTER — Telehealth: Payer: Self-pay

## 2015-04-09 NOTE — Telephone Encounter (Signed)
  Follow up Call-  Call back number 04/06/2015  Post procedure Call Back phone  # [phone]  Permission to leave phone message Yes     Patient questions:  Do you have a fever, pain , or abdominal swelling? No. Pain Score  0 *  Have you tolerated food without any problems? Yes.    Have you been able to return to your normal activities? Yes.    Do you have any questions about your discharge instructions: Diet   No. Medications  No. Follow up visit  No.  Do you have questions or concerns about your Care? No.  Actions: * If pain score is 4 or above: No action needed, pain <4.

## 2015-04-10 ENCOUNTER — Encounter: Payer: Self-pay | Admitting: Internal Medicine

## 2015-04-11 ENCOUNTER — Encounter: Payer: Self-pay | Admitting: Family Medicine

## 2015-04-11 DIAGNOSIS — Z860101 Personal history of adenomatous and serrated colon polyps: Secondary | ICD-10-CM | POA: Insufficient documentation

## 2015-04-11 DIAGNOSIS — Z86010 Personal history of colonic polyps: Secondary | ICD-10-CM | POA: Insufficient documentation

## 2015-07-18 ENCOUNTER — Other Ambulatory Visit: Payer: Self-pay | Admitting: *Deleted

## 2015-07-18 MED ORDER — PANTOPRAZOLE SODIUM 40 MG PO TBEC: 40.0000 mg | DELAYED_RELEASE_TABLET | Freq: Every day | ORAL | Status: DC

## 2015-07-19 ENCOUNTER — Other Ambulatory Visit: Payer: Self-pay | Admitting: *Deleted

## 2015-07-19 MED ORDER — PANTOPRAZOLE SODIUM 40 MG PO TBEC: 40.0000 mg | DELAYED_RELEASE_TABLET | Freq: Every day | ORAL | Status: DC

## 2015-12-21 ENCOUNTER — Ambulatory Visit (INDEPENDENT_AMBULATORY_CARE_PROVIDER_SITE_OTHER): Payer: 59

## 2015-12-21 DIAGNOSIS — Z23 Encounter for immunization: Secondary | ICD-10-CM | POA: Diagnosis not present

## 2016-01-15 ENCOUNTER — Other Ambulatory Visit: Payer: Self-pay | Admitting: Cardiology

## 2016-01-15 NOTE — Telephone Encounter (Signed)
Rx has been sent to the pharmacy electronically. ° °

## 2016-03-12 ENCOUNTER — Other Ambulatory Visit: Payer: Self-pay | Admitting: Cardiology

## 2016-05-01 ENCOUNTER — Telehealth: Payer: Self-pay | Admitting: Cardiology

## 2016-05-01 DIAGNOSIS — Z79899 Other long term (current) drug therapy: Secondary | ICD-10-CM

## 2016-05-01 NOTE — Telephone Encounter (Signed)
Follow up      Pt has an appt scheduled 07-03-16.  He want to know if he needs to have a lipid panel prior to apppt?  Please call

## 2016-05-01 NOTE — Telephone Encounter (Signed)
New message        *STAT* If patient is at the pharmacy, call can be transferred to refill team.   1. Which medications need to be refilled? (please list name of each medication and dose if known) atorvastatin 68m 2. Which pharmacy/location (including street and city if local pharmacy) is medication to be sent to? [facility] 3. Do they need a 30 day or 90 day supply? 90--pt has appt scheduled in may

## 2016-05-01 NOTE — Telephone Encounter (Signed)
Labs ordered and pt notified to have these done before scheduled appt

## 2016-05-01 NOTE — Telephone Encounter (Deleted)
Labs ordered and pt notified to have these done before scheduled appt

## 2016-05-06 ENCOUNTER — Other Ambulatory Visit: Payer: Self-pay | Admitting: Cardiology

## 2016-05-06 MED ORDER — ATORVASTATIN CALCIUM 80 MG PO TABS: 80.0000 mg | ORAL_TABLET | Freq: Every day | ORAL | 0 refills | Status: DC

## 2016-05-06 NOTE — Telephone Encounter (Signed)
Rx(s) sent to pharmacy electronically.  

## 2016-05-06 NOTE — Telephone Encounter (Signed)
New Message   *STAT* If patient is at the pharmacy, call can be transferred to refill team.   1. Which medications need to be refilled? (please list name of each medication and dose if known) Lipitor 56m  2. Which pharmacy/location (including street and city if local pharmacy) is medication to be sent to? [facility] [address]   3. Do they need a 30 day or 90 day supply? [address]

## 2016-05-14 NOTE — Telephone Encounter (Deleted)
Cath letter below is an error

## 2016-06-26 ENCOUNTER — Other Ambulatory Visit (INDEPENDENT_AMBULATORY_CARE_PROVIDER_SITE_OTHER): Payer: 59

## 2016-06-26 DIAGNOSIS — Z79899 Other long term (current) drug therapy: Secondary | ICD-10-CM | POA: Diagnosis not present

## 2016-06-26 LAB — HEPATIC FUNCTION PANEL
ALBUMIN: 4.3 g/dL (ref 3.5–5.2)
ALK PHOS: 75 U/L (ref 39–117)
ALT: 15 U/L (ref 0–53)
AST: 20 U/L (ref 0–37)
Bilirubin, Direct: 0.2 mg/dL (ref 0.0–0.3)
Total Bilirubin: 0.7 mg/dL (ref 0.2–1.2)
Total Protein: 6.9 g/dL (ref 6.0–8.3)

## 2016-06-26 LAB — LIPID PANEL
Cholesterol: 108 mg/dL (ref 0–200)
HDL: 45.8 mg/dL (ref >39.00)
LDL CALC: 45 mg/dL (ref 0–99)
NONHDL: 62.26
Total CHOL/HDL Ratio: 2
Triglycerides: 86 mg/dL (ref 0.0–149.0)
VLDL: 17.2 mg/dL (ref 0.0–40.0)

## 2016-06-30 NOTE — Progress Notes (Signed)
HPI: FU coronary artery disease status post coronary bypassing graft in January 2009. Note at that time, he had a LIMA to the LAD, free RIMA to the obtuse marginal, and sequential saphenous vein graft to the first and second diagonals, as well as saphenous vein graft to the PDA. Note preoperative carotid Dopplers performed revealed no significant right or left internal carotid artery stenosis. Nuclear study 5/14 showed EF 63 with normal perfusion. Since I last saw him, the patient denies any dyspnea on exertion, orthopnea, PND, pedal edema, palpitations, syncope or chest pain.   Current Outpatient Prescriptions  Medication Sig Dispense Refill  . aspirin 325 MG tablet Take 325 mg by mouth daily.      Marland Kitchen atorvastatin (LIPITOR) 80 MG tablet Take 1 tablet (80 mg total) by mouth daily. 90 tablet 0  . calcium citrate-vitamin D (CITRACAL+D) 315-200 MG-UNIT per tablet 2 tabs po am and 1 tab pm    . Cholecalciferol (VITAMIN D3) 2000 UNITS TABS Take 1 tablet by mouth 2 (two) times daily.    . Ferrous Sulfate Dried (IRON) 160 (50 FE) MG TBCR Take 1 tablet by mouth daily.     . magnesium oxide (MAG-OX) 400 MG tablet Take 400 mg by mouth 2 (two) times daily.    . Multiple Vitamin (MULTIVITAMIN) tablet Take 2 tablets by mouth 2 (two) times daily.     . Omega-3 Fatty Acids (OMEGA 3 PO) Take 2 capsules (3000 mg total) by mouth in the morning and take 1 capsule (1500 mg total) by mouth in the evening.    . pantoprazole (PROTONIX) 40 MG tablet Take 1 tablet (40 mg total) by mouth daily. (Patient taking differently: Take 40 mg by mouth every other day. ) 30 tablet 2  . vitamin B-12 (CYANOCOBALAMIN) 500 MCG tablet Take 500 mcg by mouth daily.     No current facility-administered medications for this visit.      Past Medical History:  Diagnosis Date  . CAD (coronary artery disease)   . Celiac sprue   . GERD (gastroesophageal reflux disease)   . Hyperlipidemia   . Low back pain    in 1980s back would  "go out" about once a year. got better after brace in 1985.  . OTITIS EXTERNA, ACUTE, LEFT     Past Surgical History:  Procedure Laterality Date  . APPENDECTOMY  1973  . CORONARY ARTERY BYPASS GRAFT  2009   5 arteries  . NASAL SEPTOPLASTY W/ TURBINOPLASTY  1985   deviated septum  . SHOULDER SURGERY Left 1962  . TONSILECTOMY, ADENOIDECTOMY, BILATERAL MYRINGOTOMY AND TUBES  1957    Social History   Social History  . Marital status: Married    Spouse name: N/A  . Number of children: N/A  . Years of education: N/A   Occupational History  . Not on file.   Social History Main Topics  . Smoking status: Never Smoker  . Smokeless tobacco: Never Used  . Alcohol use 0.6 oz/week    1 Standard drinks or equivalent per week  . Drug use: No  . Sexual activity: Not on file   Other Topics Concern  . Not on file   Social History Narrative   Married 44 years in 2016. No children. 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 Minimum 74.       Hobbies: time with wife and dogs   Exercise in evenings- walk or run (limits due to knees), exercise bike and treadmill- 5x a week          Family History  Problem Relation Age of Onset  . Hypertension Mother   . Heart attack Mother   . Heart disease Father        CABG, uncles as well  . Parkinson's disease Father        dementia potentially from parkinsons  . Hypertension Father   . Colon cancer Father 61  . Hemochromatosis Brother     ROS: no fevers or chills, productive cough, hemoptysis, dysphasia, odynophagia, melena, hematochezia, dysuria, hematuria, rash, seizure activity, orthopnea, PND, pedal edema, claudication. Remaining systems are negative.  Physical Exam: Well-developed well-nourished in no acute distress.  Skin is warm and dry.  HEENT is  normal.  Neck is supple.  Chest is clear to auscultation with normal expansion.  Cardiovascular exam is regular rate and rhythm.  Abdominal exam nontender or distended. No masses palpated. + bruit Extremities show no edema. neuro grossly intact  ECG- Sinus rhythm, Right bundle branch block, no ST changes. personally reviewed  A/P  1 Coronary artery disease-continue aspirin and statin.  2 hyperlipidemia-continue lipitor. Lipids recently performed and personally reviewed. Total cholesterol 108 with LDL 45. Liver functions normal.  3 Bruit-abdominal ultrasound to exclude aneurysm.   4 ED-he has inquired about viagra; have provided prescription for 50 mg as needed; Patient instructed to never mixed with nitroglycerin.   Kirk Ruths, MD

## 2016-07-03 ENCOUNTER — Ambulatory Visit (INDEPENDENT_AMBULATORY_CARE_PROVIDER_SITE_OTHER): Payer: 59 | Admitting: Cardiology

## 2016-07-03 ENCOUNTER — Encounter: Payer: Self-pay | Admitting: Cardiology

## 2016-07-03 VITALS — BP 140/90 | HR 69 | Ht 70.0 in | Wt 159.0 lb

## 2016-07-03 DIAGNOSIS — I10 Essential (primary) hypertension: Secondary | ICD-10-CM | POA: Diagnosis not present

## 2016-07-03 DIAGNOSIS — R0989 Other specified symptoms and signs involving the circulatory and respiratory systems: Secondary | ICD-10-CM | POA: Diagnosis not present

## 2016-07-03 DIAGNOSIS — Z136 Encounter for screening for cardiovascular disorders: Secondary | ICD-10-CM

## 2016-07-03 DIAGNOSIS — I2510 Atherosclerotic heart disease of native coronary artery without angina pectoris: Secondary | ICD-10-CM | POA: Diagnosis not present

## 2016-07-03 MED ORDER — SILDENAFIL CITRATE 50 MG PO TABS: 50.0000 mg | ORAL_TABLET | Freq: Every day | ORAL | 2 refills | Status: DC | PRN

## 2016-07-03 MED ORDER — ATORVASTATIN CALCIUM 80 MG PO TABS: 80.0000 mg | ORAL_TABLET | Freq: Every day | ORAL | 3 refills | Status: DC

## 2016-07-03 NOTE — Patient Instructions (Signed)
Medication Instructions:  Continue current medications  Labwork: None Ordered  Testing/Procedures: Your physician has requested that you have an abdominal aorta duplex. During this test, an ultrasound is used to evaluate the aorta. Allow 30 minutes for this exam. Do not eat after midnight the day before and avoid carbonated beverages   Follow-Up: Your physician wants you to follow-up in: 1 Year. You will receive a reminder letter in the mail two months in advance. If you don't receive a letter, please call our office to schedule the follow-up appointment.   Any Other Special Instructions Will Be Listed Below (If Applicable).   If you need a refill on your cardiac medications before your next appointment, please call your pharmacy.

## 2016-07-18 ENCOUNTER — Ambulatory Visit (HOSPITAL_COMMUNITY)
Admission: RE | Admit: 2016-07-18 | Discharge: 2016-07-18 | Disposition: A | Discharge status: Home / Self Care | Payer: 59 | Source: Ambulatory Visit | Attending: Cardiology | Admitting: Cardiology

## 2016-07-18 DIAGNOSIS — R0989 Other specified symptoms and signs involving the circulatory and respiratory systems: Secondary | ICD-10-CM | POA: Diagnosis not present

## 2016-07-18 DIAGNOSIS — I700 Atherosclerosis of aorta: Secondary | ICD-10-CM | POA: Insufficient documentation

## 2016-07-18 DIAGNOSIS — I714 Abdominal aortic aneurysm, without rupture: Secondary | ICD-10-CM | POA: Diagnosis not present

## 2016-07-18 DIAGNOSIS — Z951 Presence of aortocoronary bypass graft: Secondary | ICD-10-CM | POA: Insufficient documentation

## 2016-07-18 DIAGNOSIS — I2510 Atherosclerotic heart disease of native coronary artery without angina pectoris: Secondary | ICD-10-CM | POA: Insufficient documentation

## 2016-07-18 DIAGNOSIS — Z136 Encounter for screening for cardiovascular disorders: Secondary | ICD-10-CM | POA: Diagnosis not present

## 2016-07-18 DIAGNOSIS — E785 Hyperlipidemia, unspecified: Secondary | ICD-10-CM | POA: Diagnosis not present

## 2016-07-18 DIAGNOSIS — I10 Essential (primary) hypertension: Secondary | ICD-10-CM | POA: Diagnosis not present

## 2016-08-14 ENCOUNTER — Other Ambulatory Visit: Payer: Self-pay | Admitting: Family Medicine

## 2016-08-14 MED ORDER — PANTOPRAZOLE SODIUM 40 MG PO TBEC: 40.0000 mg | DELAYED_RELEASE_TABLET | Freq: Every day | ORAL | 0 refills | Status: DC

## 2016-08-14 NOTE — Telephone Encounter (Signed)
Rx sent in

## 2016-08-14 NOTE — Telephone Encounter (Signed)
Pt need new Rx for pantoprazole  [facility]arm:  [facility] [address]

## 2016-09-10 ENCOUNTER — Other Ambulatory Visit: Payer: Self-pay

## 2016-09-10 MED ORDER — PANTOPRAZOLE SODIUM 40 MG PO TBEC: 40.0000 mg | DELAYED_RELEASE_TABLET | Freq: Every day | ORAL | 0 refills | Status: DC

## 2016-10-27 ENCOUNTER — Encounter: Payer: Self-pay | Admitting: Family Medicine

## 2016-10-27 ENCOUNTER — Ambulatory Visit (INDEPENDENT_AMBULATORY_CARE_PROVIDER_SITE_OTHER): Payer: 59 | Admitting: Family Medicine

## 2016-10-27 VITALS — BP 124/72 | HR 73 | Temp 97.9°F | Ht 69.0 in | Wt 162.2 lb

## 2016-10-27 DIAGNOSIS — I10 Essential (primary) hypertension: Secondary | ICD-10-CM | POA: Diagnosis not present

## 2016-10-27 DIAGNOSIS — K219 Gastro-esophageal reflux disease without esophagitis: Secondary | ICD-10-CM

## 2016-10-27 DIAGNOSIS — R972 Elevated prostate specific antigen [PSA]: Secondary | ICD-10-CM

## 2016-10-27 DIAGNOSIS — Z0000 Encounter for general adult medical examination without abnormal findings: Secondary | ICD-10-CM | POA: Diagnosis not present

## 2016-10-27 DIAGNOSIS — I25700 Atherosclerosis of coronary artery bypass graft(s), unspecified, with unstable angina pectoris: Secondary | ICD-10-CM | POA: Diagnosis not present

## 2016-10-27 DIAGNOSIS — K900 Celiac disease: Secondary | ICD-10-CM | POA: Diagnosis not present

## 2016-10-27 DIAGNOSIS — Z1159 Encounter for screening for other viral diseases: Secondary | ICD-10-CM | POA: Diagnosis not present

## 2016-10-27 DIAGNOSIS — E785 Hyperlipidemia, unspecified: Secondary | ICD-10-CM | POA: Diagnosis not present

## 2016-10-27 LAB — COMPREHENSIVE METABOLIC PANEL
ALBUMIN: 4.4 g/dL (ref 3.5–5.2)
ALK PHOS: 72 U/L (ref 39–117)
ALT: 16 U/L (ref 0–53)
AST: 21 U/L (ref 0–37)
BILIRUBIN TOTAL: 1 mg/dL (ref 0.2–1.2)
BUN: 15 mg/dL (ref 6–23)
CO2: 27 mEq/L (ref 19–32)
CREATININE: 0.97 mg/dL (ref 0.40–1.50)
Calcium: 9.3 mg/dL (ref 8.4–10.5)
Chloride: 104 mEq/L (ref 96–112)
GFR: 82.07 mL/min (ref >60.00)
GLUCOSE: 88 mg/dL (ref 70–99)
Potassium: 4.6 mEq/L (ref 3.5–5.1)
SODIUM: 138 meq/L (ref 135–145)
TOTAL PROTEIN: 7.2 g/dL (ref 6.0–8.3)

## 2016-10-27 LAB — URINALYSIS
Bilirubin Urine: NEGATIVE
Hgb urine dipstick: NEGATIVE
KETONES UR: NEGATIVE
Leukocytes, UA: NEGATIVE
Nitrite: NEGATIVE
PH: 6 (ref 5.0–8.0)
SPECIFIC GRAVITY, URINE: 1.01 (ref 1.000–1.030)
TOTAL PROTEIN, URINE-UPE24: NEGATIVE
URINE GLUCOSE: NEGATIVE
Urobilinogen, UA: 0.2 (ref 0.0–1.0)

## 2016-10-27 LAB — CBC
HCT: 42.8 % (ref 39.0–52.0)
HEMOGLOBIN: 14.5 g/dL (ref 13.0–17.0)
MCHC: 33.8 g/dL (ref 30.0–36.0)
MCV: 92.1 fl (ref 78.0–100.0)
Platelets: 203 10*3/uL (ref 150.0–400.0)
RBC: 4.65 Mil/uL (ref 4.22–5.81)
RDW: 13.6 % (ref 11.5–15.5)
WBC: 6.9 10*3/uL (ref 4.0–10.5)

## 2016-10-27 LAB — LDL CHOLESTEROL, DIRECT: LDL DIRECT: 45 mg/dL

## 2016-10-27 LAB — PSA: PSA: 4.68 ng/mL — ABNORMAL HIGH (ref 0.10–4.00)

## 2016-10-27 MED ORDER — PANTOPRAZOLE SODIUM 40 MG PO TBEC: 40.0000 mg | DELAYED_RELEASE_TABLET | ORAL | 3 refills | Status: DC

## 2016-10-27 NOTE — Progress Notes (Addendum)
Phone: [phone]  Subjective:  Patient presents today for their annual physical. Chief complaint-noted.   See problem oriented charting- ROS- full  review of systems was completed and negative including No chest pain or shortness of breath. No headache or blurry vision.   The following were reviewed and entered/updated in epic: Past Medical History:  Diagnosis Date  . CAD (coronary artery disease)   . Celiac sprue   . GERD (gastroesophageal reflux disease)   . Hyperlipidemia   . Low back pain    in 1980s back would "go out" about once a year. got better after brace in 1985.  . OTITIS EXTERNA, ACUTE, LEFT    Patient Active Problem List   Diagnosis Date Noted  . 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 CAD, ARTERY BYPASS GRAFT 01/20/2008    Priority: High  . Hypertension 04/27/2013    Priority: Medium  . Hyperlipidemia 12/24/2006    Priority: Medium  . Basal cell carcinoma 12/11/2014    Priority: Low  . GERD 12/24/2006    Priority: Low  . History of adenomatous polyp of colon 04/11/2015   Past Surgical History:  Procedure Laterality Date  . APPENDECTOMY  1973  . CORONARY ARTERY BYPASS GRAFT  2009   5 arteries  . NASAL SEPTOPLASTY W/ TURBINOPLASTY  1985   deviated septum  . SHOULDER SURGERY Left 1962  . TONSILECTOMY, ADENOIDECTOMY, BILATERAL MYRINGOTOMY AND TUBES  1957    Family History  Problem Relation Age of Onset  . Hypertension Mother   . Heart attack Mother   . Heart disease Father        CABG, uncles as well  . Parkinson's disease Father        dementia potentially from parkinsons  . Hypertension Father   . Colon cancer Father 18  . Hemochromatosis Brother     Medications- reviewed and updated Current Outpatient Prescriptions  Medication Sig Dispense Refill  . aspirin 325 MG tablet Take 325 mg by mouth daily.      Marland Kitchen atorvastatin (LIPITOR) 80 MG tablet Take 1 tablet (80 mg total) by mouth daily. 90 tablet 3  . calcium citrate-vitamin D  (CITRACAL+D) 315-200 MG-UNIT per tablet 2 tabs po am and 1 tab pm    . Cholecalciferol (VITAMIN D3) 2000 UNITS TABS Take 1 tablet by mouth 2 (two) times daily.    . Ferrous Sulfate Dried (IRON) 160 (50 FE) MG TBCR Take 1 tablet by mouth daily.     . magnesium oxide (MAG-OX) 400 MG tablet Take 400 mg by mouth 2 (two) times daily.    . Multiple Vitamin (MULTIVITAMIN) tablet Take 2 tablets by mouth 2 (two) times daily.     . Omega-3 Fatty Acids (OMEGA 3 PO) Take 2 capsules (3000 mg total) by mouth in the morning and take 1 capsule (1500 mg total) by mouth in the evening.    . pantoprazole (PROTONIX) 40 MG tablet Take 1 tablet (40 mg total) by mouth every other day. 46 tablet 3  . sildenafil (VIAGRA) 50 MG tablet Take 1 tablet (50 mg total) by mouth daily as needed for erectile dysfunction. 10 tablet 2  . vitamin B-12 (CYANOCOBALAMIN) 500 MCG tablet Take 500 mcg by mouth daily.     No current facility-administered medications for this visit.     Allergies-reviewed and updated Allergies  Allergen Reactions  . Epipen [Epinephrine Hcl]     syncope    Social History   Social History  . Marital  status: Married    Spouse name: N/A  . Number of children: N/A  . Years of education: N/A   Social History Main Topics  . Smoking status: Never Smoker  . Smokeless tobacco: Never Used  . Alcohol use 0.6 oz/week    1 Standard drinks or equivalent per week  . Drug use: No  . Sexual activity: Not Asked   Other Topics Concern  . None   Social History Narrative   Married 44 years in 2016. No children. 8 dogs- chihuahua and mixes of chihuahua      [employer]. 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 Minimum 74.       Hobbies: time with wife and dogs   Exercise in evenings- walk or run (limits due to knees), exercise bike and treadmill- 5x a week           Objective: BP 124/72 (BP Location: Left Arm, Patient Position: Sitting, Cuff Size: Large)   Pulse 73   Temp 97.9 F (36.6 C) (Oral)   Ht 5' 9"  (1.753 m)   Wt 162 lb 3.2 oz (73.6 kg)   SpO2 97%   BMI 23.95 kg/m  Gen: NAD, resting comfortably HEENT: Mucous membranes are moist. Oropharynx normal Neck: no thyromegaly CV: RRR no murmurs rubs or gallops Lungs: CTAB no crackles, wheeze, rhonchi Abdomen: soft/nontender/nondistended/normal bowel sounds. No rebound or guarding.  Ext: no edema Skin: warm, dry Neuro: grossly normal, moves all extremities, PERRLA Rectal: normal tone, diffusely enlarged prostate, no masses or tenderness  Assessment/Plan:  66 y.o. male presenting for annual physical.  Health Maintenance counseling: 1. Anticipatory guidance: Patient counseled regarding regular dental exams -16 months, eye exams - yearly, wearing seatbelts.  2. Risk factor reduction:  Advised patient of need for regular exercise and diet rich and fruits and vegetables to reduce risk of heart attack and stroke. Exercise- minimum 4 days a week- bike or treadmill (4 mph and incline). Diet-very reasonable- low fat/heart healthy.  Wt Readings from Last 3 Encounters:  10/27/16 162 lb 3.2 oz (73.6 kg)  07/03/16 159 lb (72.1 kg)  04/06/15 163 lb (73.9 kg)   3. Immunizations/screenings/ancillary studies- pneumovax 23 with flu shot. Advised flu shot - plans towards end of october. Discussed shingrix availability issues Immunization History  Administered Date(s) Administered  . Influenza, High Dose Seasonal PF 12/21/2015  . Influenza,inj,Quad PF,6+ Mos 01/05/2013, 11/24/2013, 12/28/2014  . Pneumococcal Conjugate-13 01/19/2015  . Td 06/14/1996, 02/19/2007  . Zoster 06/07/2012  4. Prostate cancer screening-  higher end PSA but trend has not been concerning. Will update pSA today. BPH on exam, does not get up to urinate  Lab Results  Component Value Date   PSA 3.63 01/19/2015   PSA 4.21 (H)  01/12/2015   PSA 3.68 04/27/2013   5. Colon cancer screening - referred to GI last visit - seen 04/06/15 with advised 5 year repeat.  6. Skin cancer screening- Sees Dr. Wilhemina Bonito with history basal cell. advised regular sunscreen use. Denies worrisome, changing, or new skin lesions.   Status of chronic or acute concerns   Mild abdominal aortic aneurysm detected by Dr. Stanford Breed- 1 year repeat planned  CAD- sees Dr. Stanford Breed. CABG 2009 5 vessel. Compliant with statin and aspirin  Celiac controlled by avoiding gluten  GERD- needs refill on protonix. Doing well with every other day- every 3rd day causes issue. Takes vitamin  b12  LIpid goal <70 for LDL with CAD. On atorvastatin 51m  BP controlled last few visit. Had been on lisinopril . Home checks averaging 115-120/65-70 BP Readings from Last 3 Encounters:  10/27/16 124/72  07/03/16 140/90  04/06/15 120/74   Some low back pain a few weeks ago- was still able to do his exercises- if sat in chair for more than 10 minutes- back felt very week and that has resolved.   Return in about 1 year (around 10/27/2017) for physical.  Orders Placed This Encounter  Procedures  . Urinalysis    Standing Status:   Future    Standing Expiration Date:   10/27/2017  . LDL cholesterol, direct    [place]    Standing Status:   Future    Standing Expiration Date:   10/27/2017  . CBC    Standing Status:   Future    Standing Expiration Date:   10/27/2017  . Comprehensive metabolic panel    [place]    Standing Status:   Future    Standing Expiration Date:   10/27/2017  . Hepatitis C antibody    Standing Status:   Future    Standing Expiration Date:   10/27/2017  . PSA    Standing Status:   Future    Standing Expiration Date:   10/27/2017    Meds ordered this encounter  Medications  . pantoprazole (PROTONIX) 40 MG tablet    Sig: Take 1 tablet (40 mg total) by mouth every other day.    Dispense:  46 tablet    Refill:  3   Return precautions advised.   SGarret Reddish MD

## 2016-10-27 NOTE — Patient Instructions (Addendum)
Flu shot at end of October  Prevnar 13 today, final pneumonia shot in 1 year

## 2016-10-27 NOTE — Addendum Note (Signed)
Addended by: Tomi Likens on: 10/27/2016 10:53 AM   Modules accepted: Orders

## 2016-10-27 NOTE — Addendum Note (Signed)
Addended by: Tomi Likens on: 10/27/2016 10:54 AM   Modules accepted: Orders

## 2016-10-28 ENCOUNTER — Encounter: Payer: Self-pay | Admitting: Family Medicine

## 2016-10-28 ENCOUNTER — Other Ambulatory Visit: Payer: Self-pay

## 2016-10-28 DIAGNOSIS — R972 Elevated prostate specific antigen [PSA]: Secondary | ICD-10-CM

## 2016-10-28 LAB — HEPATITIS C ANTIBODY
Hepatitis C Ab: NONREACTIVE
SIGNAL TO CUT-OFF: 0.04 (ref <1.00)

## 2016-11-14 ENCOUNTER — Other Ambulatory Visit (INDEPENDENT_AMBULATORY_CARE_PROVIDER_SITE_OTHER): Payer: 59

## 2016-11-14 ENCOUNTER — Other Ambulatory Visit: Payer: Self-pay | Admitting: Family Medicine

## 2016-11-14 DIAGNOSIS — R972 Elevated prostate specific antigen [PSA]: Secondary | ICD-10-CM | POA: Diagnosis not present

## 2016-11-14 LAB — PSA
PSA: 4.2
PSA: 4.2 ng/mL — AB (ref 0.10–4.00)

## 2016-11-14 NOTE — Progress Notes (Signed)
amb to ur

## 2016-11-24 ENCOUNTER — Ambulatory Visit (INDEPENDENT_AMBULATORY_CARE_PROVIDER_SITE_OTHER): Payer: 59

## 2016-11-24 DIAGNOSIS — Z23 Encounter for immunization: Secondary | ICD-10-CM

## 2016-12-03 ENCOUNTER — Telehealth: Payer: Self-pay | Admitting: Family Medicine

## 2016-12-03 NOTE — Telephone Encounter (Signed)
Patient called inquiring about his referral for urology. I provided the patient with their telephone number to help facilitate getting scheduled. Patient is aware that Dr. Yong Channel and staff are gone for the day however, call patient tomorrow to ensure he was scheduled successfully per patient.

## 2016-12-04 NOTE — Telephone Encounter (Signed)
Called and spoke with patient who did call yesterday and he was able to get an appointment scheduled for November

## 2017-02-11 DIAGNOSIS — N5201 Erectile dysfunction due to arterial insufficiency: Secondary | ICD-10-CM | POA: Diagnosis not present

## 2017-02-11 DIAGNOSIS — R972 Elevated prostate specific antigen [PSA]: Secondary | ICD-10-CM | POA: Diagnosis not present

## 2017-03-27 ENCOUNTER — Ambulatory Visit: Payer: Self-pay | Admitting: *Deleted

## 2017-03-27 NOTE — Telephone Encounter (Signed)
Pt reports B/P "creeping up" since last week. Reports baseline 114-120/60-70. Reports B/P last week in 346'I systolic. This am 151/89, one hour later 155/90. Denies any other symptoms; no headache or dizziness. Does report increased stress at work. No B/P meds. Appt secured with Dr. Yong Channel 04/06/17. Care advise given per protocol; instructed to keep journal of B/P measurements.  Reason for Disposition . [1] Systolic BP  >= 947 OR Diastolic >= 90 AND [1] not taking BP medications  Answer Assessment - Initial Assessment Questions 1. BLOOD PRESSURE: "What is the blood pressure?" "Did you take at least two measurements 5 minutes apart?"     155/90 2. ONSET: "When did you take your blood pressure?"     Yesterday 130/80, this AM early 151/89. One hour ago 155/90 3. HOW: "How did you obtain the blood pressure?" (e.g., visiting nurse, automatic home BP monitor)     Home monitor 4. HISTORY: "Do you have a history of high blood pressure?"     No 5. MEDICATIONS: "Are you taking any medications for blood pressure?" "Have you missed any doses recently?"     No 6. OTHER SYMPTOMS: "Do you have any symptoms?" (e.g., headache, chest pain, blurred vision, difficulty breathing, weakness)     More stress at work since over 1 month.  Protocols used: HIGH BLOOD PRESSURE-A-AH

## 2017-04-06 ENCOUNTER — Encounter: Payer: Self-pay | Admitting: Family Medicine

## 2017-04-06 ENCOUNTER — Ambulatory Visit (INDEPENDENT_AMBULATORY_CARE_PROVIDER_SITE_OTHER): Payer: Medicare Other | Admitting: Family Medicine

## 2017-04-06 ENCOUNTER — Ambulatory Visit: Payer: 59 | Admitting: Family Medicine

## 2017-04-06 VITALS — BP 140/82 | HR 85 | Temp 97.9°F | Ht 69.0 in | Wt 164.0 lb

## 2017-04-06 DIAGNOSIS — Z23 Encounter for immunization: Secondary | ICD-10-CM | POA: Diagnosis not present

## 2017-04-06 DIAGNOSIS — I10 Essential (primary) hypertension: Secondary | ICD-10-CM

## 2017-04-06 DIAGNOSIS — N509 Disorder of male genital organs, unspecified: Secondary | ICD-10-CM

## 2017-04-06 NOTE — Assessment & Plan Note (Signed)
S: controlled with diet/exercise.   Home readings mostly 107-115/65-69 over course of 2018  In February has noted higher stress- usually handles it well but stress particularly high lately. #s more 120s to 675/QGBEEFEOFH diastolic. Did have 1 # up to 155/90. With exercise drops down. Weekend Bps much better controlled compared to when working. Average over last few weeks 127/78.  Stress has improved in last week and BP improved.  BP Readings from Last 3 Encounters:  04/06/17 140/82  10/27/16 124/72  07/03/16 140/90  A/P: We discussed blood pressure goal of <140/90- has white coat element. At home he has been at goal <130/80. Continue without meds. He mainly wanted my opinion considering higher trend and then the one reading 155/90 which is so out of the norm for him- reassurance provided

## 2017-04-06 NOTE — Progress Notes (Signed)
Subjective:  [name] is a 67 y.o. year old very pleasant male patient who presents for/with See problem oriented charting ROS- No chest pain or shortness of breath. No headache or blurry vision.  No issues with exercise.    Past Medical History-  Patient Active Problem List   Diagnosis Date Noted  . 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 CAD, ARTERY BYPASS GRAFT 01/20/2008    Priority: High  . Hypertension 04/27/2013    Priority: Medium  . Hyperlipidemia 12/24/2006    Priority: Medium  . Basal cell carcinoma 12/11/2014    Priority: Low  . GERD 12/24/2006    Priority: Low  . History of adenomatous polyp of colon 04/11/2015    Medications- reviewed and updated Current Outpatient Medications  Medication Sig Dispense Refill  . aspirin 325 MG tablet Take 325 mg by mouth daily.      Marland Kitchen atorvastatin (LIPITOR) 80 MG tablet Take 1 tablet (80 mg total) by mouth daily. 90 tablet 3  . calcium citrate-vitamin D (CITRACAL+D) 315-200 MG-UNIT per tablet 2 tabs po am and 1 tab pm    . Cholecalciferol (VITAMIN D3) 2000 UNITS TABS Take 1 tablet by mouth 2 (two) times daily.    . Ferrous Sulfate Dried (IRON) 160 (50 FE) MG TBCR Take 1 tablet by mouth daily.     . magnesium oxide (MAG-OX) 400 MG tablet Take 400 mg by mouth 2 (two) times daily.    . Multiple Vitamin (MULTIVITAMIN) tablet Take 2 tablets by mouth 2 (two) times daily.     . Omega-3 Fatty Acids (OMEGA 3 PO) Take 2 capsules (3000 mg total) by mouth in the morning and take 1 capsule (1500 mg total) by mouth in the evening.    . pantoprazole (PROTONIX) 40 MG table Take 1 tablet (40 mg total) by mouth every other day. 46 tablet 3  . sildenafil (VIAGRA) 50 MG tablet Take 1 tablet (50 mg total) by mouth daily as needed for erectile dysfunction. 10 tablet 2  . vitamin B-12 (CYANOCOBALAMIN) 500 MCG tablet Take 500 mcg by mouth daily.       Objective: BP 140/82 (BP Location: Left Arm, Patient Position: Sitting, Cuff Size: Large)    Pulse 85   Temp 97.9 F (36.6 C) (Oral)   Ht 5' 9"  (1.753 m)   Wt 164 lb (74.4 kg)   SpO2 96%   BMI 24.22 kg/m  Gen: NAD, resting comfortably CV: RRR no murmurs rubs or gallops Lungs: CTAB no crackles, wheeze, rhonchi Ext: no edema Skin: warm, dry Gu: at base of right side of scrotum 5 mm subcutaneous round lesion nontender. Otherwise testicles and penis and scrotum normal  Assessment/Plan:  Scrotal lesion S:  3 weeks ago noted bump/possible cyst like bump in pelvic area - soft about the size of 2 peas. Has shrunk about 50% - seems to have moved slightly- right base of scrotum.  A/P: I am not sure what this is. Not part of testicle. Seems subcutaneous - not sure why this would have moved though. Doubt high risk etiology- reassurance provided. At same time, we discussed if not continuing to improve or area enlarges on scrotum to let us know and we would refer to urology (though it may take a few months to get in)  Hypertension S: controlled with diet/exercise.   Home readings mostly 107-115/65-69 over course of 2018  In February has noted higher stress- usually handles it well but stress particularly high lately. #s  more 120s to 122/UIVHOYWVXU diastolic. Did have 1 # up to 155/90. With exercise drops down. Weekend Bps much better controlled compared to when working. Average over last few weeks 127/78.  Stress has improved in last week and BP improved.  BP Readings from Last 3 Encounters:  04/06/17 140/82  10/27/16 124/72  07/03/16 140/90  A/P: We discussed blood pressure goal of <140/90- has white coat element. At home he has been at goal <130/80. Continue without meds. He mainly wanted my opinion considering higher trend and then the one reading 155/90 which is so out of the norm for him- reassurance provided  Lab/Order associations: Need for prophylactic vaccination against Streptococcus pneumoniae (pneumococcus) - Plan: Pneumococcal polysaccharide vaccine 23-valent greater than or  equal to 2yo subcutaneous/IM  Return precautions advised.  Garret Reddish, MD

## 2017-04-06 NOTE — Patient Instructions (Addendum)
Goal for home averages <130/80  Pneumovax 23 today (final pneumonia shot)  we discussed if not continuing to improve or area enlarges on scrotum to let us know and we would refer to urology (though it may take a few months to get in)  Please get shingrix at your pharmacy as well as Tdap (tetanus). Please send [place] a mychart when you get these.

## 2017-05-30 ENCOUNTER — Other Ambulatory Visit: Payer: Self-pay | Admitting: Cardiology

## 2017-06-25 ENCOUNTER — Other Ambulatory Visit: Payer: Self-pay

## 2017-06-25 MED ORDER — ATORVASTATIN CALCIUM 80 MG PO TABS: 80.0000 mg | ORAL_TABLET | Freq: Every day | ORAL | 0 refills | Status: DC

## 2017-08-06 ENCOUNTER — Other Ambulatory Visit: Payer: Self-pay | Admitting: Cardiology

## 2017-09-07 ENCOUNTER — Other Ambulatory Visit: Payer: Self-pay | Admitting: Cardiology

## 2017-09-08 NOTE — Telephone Encounter (Signed)
Rx request sent to pharmacy.  

## 2017-10-02 ENCOUNTER — Other Ambulatory Visit: Payer: Self-pay | Admitting: Cardiology

## 2017-10-09 ENCOUNTER — Other Ambulatory Visit: Payer: Self-pay | Admitting: *Deleted

## 2017-10-09 MED ORDER — PANTOPRAZOLE SODIUM 40 MG PO TBEC: 40.0000 mg | DELAYED_RELEASE_TABLET | ORAL | 1 refills | Status: DC

## 2017-10-22 DIAGNOSIS — H02055 Trichiasis without entropian left lower eyelid: Secondary | ICD-10-CM | POA: Diagnosis not present

## 2017-10-22 DIAGNOSIS — H25813 Combined forms of age-related cataract, bilateral: Secondary | ICD-10-CM | POA: Diagnosis not present

## 2017-10-24 ENCOUNTER — Other Ambulatory Visit: Payer: Self-pay | Admitting: Cardiology

## 2017-11-10 ENCOUNTER — Other Ambulatory Visit: Payer: Self-pay | Admitting: Cardiology

## 2017-12-01 ENCOUNTER — Ambulatory Visit (INDEPENDENT_AMBULATORY_CARE_PROVIDER_SITE_OTHER): Payer: Medicare Other

## 2017-12-01 ENCOUNTER — Encounter: Payer: Self-pay | Admitting: Family Medicine

## 2017-12-01 DIAGNOSIS — Z23 Encounter for immunization: Secondary | ICD-10-CM

## 2017-12-08 DIAGNOSIS — D1801 Hemangioma of skin and subcutaneous tissue: Secondary | ICD-10-CM | POA: Diagnosis not present

## 2017-12-08 DIAGNOSIS — L814 Other melanin hyperpigmentation: Secondary | ICD-10-CM | POA: Diagnosis not present

## 2017-12-08 DIAGNOSIS — L570 Actinic keratosis: Secondary | ICD-10-CM | POA: Diagnosis not present

## 2017-12-08 DIAGNOSIS — Z85828 Personal history of other malignant neoplasm of skin: Secondary | ICD-10-CM | POA: Diagnosis not present

## 2017-12-08 DIAGNOSIS — D225 Melanocytic nevi of trunk: Secondary | ICD-10-CM | POA: Diagnosis not present

## 2017-12-15 NOTE — Progress Notes (Signed)
HPI: FU coronary artery disease status post coronary bypassing graft in January 2009. Note at that time, he had a LIMA to the LAD, free RIMA to the obtuse marginal, and sequential saphenous vein graft to the first and second diagonals, as well as saphenous vein graft to the PDA. Note preoperative carotid Dopplers performed revealed no significant right or left internal carotid artery stenosis. Nuclear study 5/14 showed EF 63 with normal perfusion. Abd ultrasound 6/18 showed 3.1 x 3.2 cm AAA. Since I last saw him, the patient denies any dyspnea on exertion, orthopnea, PND, pedal edema, palpitations, syncope or chest pain.   Current Outpatient Medications  Medication Sig Dispense Refill  . aspirin 325 MG tablet Take 325 mg by mouth daily.      Marland Kitchen atorvastatin (LIPITOR) 80 MG tablet TAKE 1 TABLET (80 MG TOTAL) BY MOUTH DAILY. PLEASE SCHEDULE APPOINTMENT FOR FURTHER REFILLS. 7 tablet 0  . calcium citrate-vitamin D (CITRACAL+D) 315-200 MG-UNIT per tablet 2 tabs po am and 1 tab pm    . Cholecalciferol (VITAMIN D3) 2000 UNITS TABS Take 1 tablet by mouth 2 (two) times daily.    . Ferrous Sulfate Dried (IRON) 160 (50 FE) MG TBCR Take 1 tablet by mouth daily.     . magnesium oxide (MAG-OX) 400 MG tablet Take 400 mg by mouth 2 (two) times daily.    . Multiple Vitamin (MULTIVITAMIN) tablet Take 2 tablets by mouth 2 (two) times daily.     . Omega-3 Fatty Acids (OMEGA 3 PO) Take 2 capsules (3000 mg total) by mouth in the morning and take 1 capsule (1500 mg total) by mouth in the evening.    . pantoprazole (PROTONIX) 40 MG tablet Take 1 tablet (40 mg total) by mouth every other day. 46 tablet 1  . sildenafil (VIAGRA) 50 MG tablet Take 1 tablet (50 mg total) by mouth daily as needed for erectile dysfunction. 10 tablet 2  . vitamin B-12 (CYANOCOBALAMIN) 500 MCG tablet Take 500 mcg by mouth daily.     No current facility-administered medications for this visit.      Past Medical History:  Diagnosis Date   . CAD (coronary artery disease)   . Celiac sprue   . GERD (gastroesophageal reflux disease)   . Hyperlipidemia   . Low back pain    in 1980s back would "go out" about once a year. got better after brace in 1985.  . OTITIS EXTERNA, ACUTE, LEFT     Past Surgical History:  Procedure Laterality Date  . APPENDECTOMY  1973  . CORONARY ARTERY BYPASS GRAFT  2009   5 arteries  . NASAL SEPTOPLASTY W/ TURBINOPLASTY  1985   deviated septum  . SHOULDER SURGERY Left 1962  . TONSILECTOMY, ADENOIDECTOMY, BILATERAL MYRINGOTOMY AND TUBES  1957    Social History   Socioeconomic History  . Marital status: Married    Spouse name: Not on file  . Number of children: Not on file  . Years of education: Not on file  . Highest education level: Not on file  Occupational History  . Not on file  Social Needs  . Financial resource strain: Not on file  . Food insecurity:    Worry: Not on file    Inability: Not on file  . Transportation needs:    Medical: Not on file    Non-medical: Not on file  Tobacco Use  . Smoking status: Never Smoker  . Smokeless tobacco: Never Used  Substance and Sexual Activity  .  Alcohol use: Yes    Alcohol/week: 1.0 standard drinks    Types: 1 Standard drinks or equivalent per week  . Drug use: No  . Sexual activity: Not on file  Lifestyle  . Physical activity:    Days per week: Not on file    Minutes per session: Not on file  . Stress: Not on file  Relationships  . Social connections:    Talks on phone: Not on file    Gets together: Not on file    Attends religious service: Not on file    Active member of club or organization: Not on file    Attends meetings of clubs or organizations: Not on file    Relationship status: Not on file  . Intimate partner violence:    Fear of current or ex partner: Not on file    Emotionally abused: Not on file    Physically abused: Not on file    Forced sexual activity: Not on file  Other Topics Concern  . Not on file    Social History Narrative   Married 44 years in 2016. No children. 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 Minimum 74.       Hobbies: time with wife and dogs   Exercise in evenings- walk or run (limits due to knees), exercise bike and treadmill- 5x a week       Family History  Problem Relation Age of Onset  . Hypertension Mother   . Heart attack Mother   . Heart disease Father        CABG, uncles as well  . Parkinson's disease Father        dementia potentially from parkinsons  . Hypertension Father   . Colon cancer Father 65  . Hemochromatosis Brother     ROS: no fevers or chills, productive cough, hemoptysis, dysphasia, odynophagia, melena, hematochezia, dysuria, hematuria, rash, seizure activity, orthopnea, PND, pedal edema, claudication. Remaining systems are negative.  Physical Exam: Well-developed well-nourished in no acute distress.  Skin is warm and dry.  HEENT is normal.  Neck is supple.  Chest is clear to auscultation with normal expansion.  Cardiovascular exam is regular rate and rhythm.  Abdominal exam nontender or distended. No masses palpated. Extremities show no edema. neuro grossly intact  ECG-normal sinus rhythm at a rate of 72.  First-degree AV block.  Right bundle branch block.  Personally reviewed  A/P  1 coronary artery disease status post coronary artery bypass graft-patient denies chest pain.  Continue medical therapy with aspirin (decrease to 81 mg daily) and statin.  2 hyperlipidemia-continue present dose of Lipitor.  Laboratories drawn November 2019 personally reviewed.  Total cholesterol 126 with LDL 58.  Liver function is normal.  3 abdominal aortic aneurysm-schedule follow-up study.  Kirk Ruths, MD

## 2017-12-21 ENCOUNTER — Other Ambulatory Visit: Payer: Self-pay | Admitting: *Deleted

## 2017-12-21 DIAGNOSIS — Z79899 Other long term (current) drug therapy: Secondary | ICD-10-CM

## 2017-12-21 DIAGNOSIS — I2510 Atherosclerotic heart disease of native coronary artery without angina pectoris: Secondary | ICD-10-CM

## 2017-12-22 LAB — COMPREHENSIVE METABOLIC PANEL
ALBUMIN: 4.5 g/dL (ref 3.6–4.8)
ALK PHOS: 79 IU/L (ref 39–117)
ALT: 19 IU/L (ref 0–44)
AST: 24 IU/L (ref 0–40)
Albumin/Globulin Ratio: 1.7 (ref 1.2–2.2)
BUN / CREAT RATIO: 14 (ref 10–24)
BUN: 15 mg/dL (ref 8–27)
Bilirubin Total: 0.8 mg/dL (ref 0.0–1.2)
CALCIUM: 9.4 mg/dL (ref 8.6–10.2)
CO2: 24 mmol/L (ref 20–29)
CREATININE: 1.11 mg/dL (ref 0.76–1.27)
Chloride: 105 mmol/L (ref 96–106)
GFR, EST AFRICAN AMERICAN: 78 mL/min/{1.73_m2} (ref >59)
GFR, EST NON AFRICAN AMERICAN: 68 mL/min/{1.73_m2} (ref >59)
GLOBULIN, TOTAL: 2.7 g/dL (ref 1.5–4.5)
Glucose: 79 mg/dL (ref 65–99)
Potassium: 4.7 mmol/L (ref 3.5–5.2)
SODIUM: 142 mmol/L (ref 134–144)
TOTAL PROTEIN: 7.2 g/dL (ref 6.0–8.5)

## 2017-12-22 LAB — LIPID PANEL
CHOL/HDL RATIO: 2.5 ratio (ref 0.0–5.0)
Cholesterol, Total: 126 mg/dL (ref 100–199)
HDL: 51 mg/dL (ref >39)
LDL CALC: 58 mg/dL (ref 0–99)
Triglycerides: 85 mg/dL (ref 0–149)
VLDL Cholesterol Cal: 17 mg/dL (ref 5–40)

## 2017-12-25 ENCOUNTER — Encounter: Payer: Self-pay | Admitting: Cardiology

## 2017-12-25 ENCOUNTER — Ambulatory Visit (INDEPENDENT_AMBULATORY_CARE_PROVIDER_SITE_OTHER): Payer: Medicare Other | Admitting: Cardiology

## 2017-12-25 ENCOUNTER — Other Ambulatory Visit: Payer: Self-pay

## 2017-12-25 VITALS — BP 120/70 | HR 72 | Ht 70.0 in | Wt 166.0 lb

## 2017-12-25 DIAGNOSIS — I714 Abdominal aortic aneurysm, without rupture, unspecified: Secondary | ICD-10-CM

## 2017-12-25 DIAGNOSIS — I2510 Atherosclerotic heart disease of native coronary artery without angina pectoris: Secondary | ICD-10-CM

## 2017-12-25 DIAGNOSIS — I10 Essential (primary) hypertension: Secondary | ICD-10-CM

## 2017-12-25 DIAGNOSIS — I25700 Atherosclerosis of coronary artery bypass graft(s), unspecified, with unstable angina pectoris: Secondary | ICD-10-CM

## 2017-12-25 NOTE — Patient Instructions (Signed)
Medication Instructions:  Continue same medications If you need a refill on your cardiac medications before your next appointment, please call your pharmacy.   Lab work: None ordered   Testing/Procedures:  Abdominal U/S to follow up aneurysm   Follow-Up: At [facility], you and your health needs are our priority.  As part of our continuing mission to provide you with exceptional heart care, we have created designated Provider Care Teams.  These Care Teams include your primary Cardiologist (physician) and Advanced Practice Providers (APPs -  Physician Assistants and Nurse Practitioners) who all work together to provide you with the care you need, when you need it. . Follow Up with Dr.Crenshaw in 1 year  Call 2 months before to schedule

## 2017-12-28 ENCOUNTER — Other Ambulatory Visit: Payer: Self-pay | Admitting: Cardiology

## 2017-12-28 DIAGNOSIS — I714 Abdominal aortic aneurysm, without rupture, unspecified: Secondary | ICD-10-CM

## 2017-12-28 DIAGNOSIS — I25700 Atherosclerosis of coronary artery bypass graft(s), unspecified, with unstable angina pectoris: Secondary | ICD-10-CM

## 2017-12-29 ENCOUNTER — Ambulatory Visit (HOSPITAL_COMMUNITY)
Admission: RE | Admit: 2017-12-29 | Discharge: 2017-12-29 | Disposition: A | Discharge status: Home / Self Care | Payer: Medicare Other | Source: Ambulatory Visit | Attending: Cardiovascular Disease | Admitting: Cardiovascular Disease

## 2017-12-29 DIAGNOSIS — I25700 Atherosclerosis of coronary artery bypass graft(s), unspecified, with unstable angina pectoris: Secondary | ICD-10-CM | POA: Insufficient documentation

## 2017-12-29 DIAGNOSIS — I714 Abdominal aortic aneurysm, without rupture, unspecified: Secondary | ICD-10-CM

## 2017-12-31 ENCOUNTER — Other Ambulatory Visit: Payer: Self-pay | Admitting: *Deleted

## 2017-12-31 DIAGNOSIS — I714 Abdominal aortic aneurysm, without rupture, unspecified: Secondary | ICD-10-CM

## 2018-01-15 ENCOUNTER — Other Ambulatory Visit: Payer: Self-pay | Admitting: Cardiology

## 2018-04-05 ENCOUNTER — Other Ambulatory Visit: Payer: Self-pay

## 2018-04-05 MED ORDER — PANTOPRAZOLE SODIUM 40 MG PO TBEC: 40.0000 mg | DELAYED_RELEASE_TABLET | ORAL | 1 refills | Status: DC

## 2018-04-12 ENCOUNTER — Ambulatory Visit (INDEPENDENT_AMBULATORY_CARE_PROVIDER_SITE_OTHER): Payer: Medicare Other | Admitting: Family Medicine

## 2018-04-12 ENCOUNTER — Encounter: Payer: Self-pay | Admitting: Family Medicine

## 2018-04-12 VITALS — BP 118/82 | HR 78 | Temp 98.1°F | Ht 70.0 in | Wt 160.2 lb

## 2018-04-12 DIAGNOSIS — I25810 Atherosclerosis of coronary artery bypass graft(s) without angina pectoris: Secondary | ICD-10-CM | POA: Diagnosis not present

## 2018-04-12 DIAGNOSIS — K900 Celiac disease: Secondary | ICD-10-CM

## 2018-04-12 DIAGNOSIS — E785 Hyperlipidemia, unspecified: Secondary | ICD-10-CM

## 2018-04-12 DIAGNOSIS — I10 Essential (primary) hypertension: Secondary | ICD-10-CM | POA: Diagnosis not present

## 2018-04-12 DIAGNOSIS — K219 Gastro-esophageal reflux disease without esophagitis: Secondary | ICD-10-CM | POA: Diagnosis not present

## 2018-04-12 DIAGNOSIS — Z79899 Other long term (current) drug therapy: Secondary | ICD-10-CM | POA: Diagnosis not present

## 2018-04-12 LAB — IBC + FERRITIN
Ferritin: 95.1 ng/mL (ref 22.0–322.0)
Iron: 128 ug/dL (ref 42–165)
Saturation Ratios: 36 % (ref 20.0–50.0)
Transferrin: 254 mg/dL (ref 212.0–360.0)

## 2018-04-12 LAB — VITAMIN B12: Vitamin B-12: 739 pg/mL (ref 211–911)

## 2018-04-12 LAB — CBC
HEMATOCRIT: 44.3 % (ref 39.0–52.0)
HEMOGLOBIN: 15.3 g/dL (ref 13.0–17.0)
MCHC: 34.5 g/dL (ref 30.0–36.0)
MCV: 90.2 fl (ref 78.0–100.0)
Platelets: 198 10*3/uL (ref 150.0–400.0)
RBC: 4.91 Mil/uL (ref 4.22–5.81)
RDW: 13.5 % (ref 11.5–15.5)
WBC: 7.3 10*3/uL (ref 4.0–10.5)

## 2018-04-12 NOTE — Patient Instructions (Addendum)
Health Maintenance Due  Topic Date Due  . TETANUS/TDAP Please stop by your local pharmacy for vaccination 02/18/2017   Please stop by lab before you go- CBC and B12 and ferritin If you do not have mychart- we will call you about results within 5 business days of [place] receiving them.  If you have mychart- we will send your results within 3 business days of [place] receiving them.  If abnormal or we want to clarify a result, we will call or mychart you to make sure you receive the message.  If you have questions or concerns or don't hear within 5-7 days, please send [place] a message or call us.

## 2018-04-12 NOTE — Progress Notes (Signed)
Phone [phone]   Subjective:  [name] is a 68 y.o. year old very pleasant male patient who presents for/with See problem oriented charting ROS- No chest pain or shortness of breath. No headache  (outside perhaps twice a year) or blurry vision.    Past Medical History-  Patient Active Problem List   Diagnosis Date Noted  . 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 CAD, ARTERY BYPASS GRAFT 01/20/2008    Priority: High  . Hypertension 04/27/2013    Priority: Medium  . Hyperlipidemia 12/24/2006    Priority: Medium  . Basal cell carcinoma 12/11/2014    Priority: Low  . GERD 12/24/2006    Priority: Low  . History of adenomatous polyp of colon 04/11/2015    Medications- reviewed and updated Current Outpatient Medications  Medication Sig Dispense Refill  . aspirin EC 81 MG tablet Take 1 tablet (81 mg total) by mouth daily.    Marland Kitchen atorvastatin (LIPITOR) 80 MG tablet Take 1 tablet (80 mg total) by mouth daily. 90 tablet 3  . calcium citrate-vitamin D (CITRACAL+D) 315-200 MG-UNIT per tablet 2 tablets. 2 tabs at bedtime    . Cholecalciferol (VITAMIN D3) 2000 UNITS TABS Take 1 tablet by mouth.     . Ferrous Sulfate Dried (IRON) 160 (50 FE) MG TBCR Take 1 tablet by mouth daily.     . magnesium oxide (MAG-OX) 400 MG tablet Take 400 mg by mouth 2 (two) times daily.    . Multiple Vitamin (MULTIVITAMIN) tablet Take 2 tablets by mouth 2 (two) times daily.     . Omega-3 Fatty Acids (OMEGA 3 PO) Take 2 capsules (3000 mg total) by mouth in the morning and take 1 capsule (1500 mg total) by mouth in the evening.    . pantoprazole (PROTONIX) 40 MG tablet Take 1 tablet (40 mg total) by mouth every other day. (Patient taking differently: Take 40 mg by mouth every 3 (three) days. ) 46 tablet 1  . sildenafil (VIAGRA) 50 MG tablet Take 1 tablet (50 mg total) by mouth daily as needed for erectile dysfunction. 10 tablet 2  . vitamin B-12 (CYANOCOBALAMIN) 500 MCG tablet Take 500 mcg by mouth daily.      No current facility-administered medications for this visit.      Objective:  BP 118/82 (BP Location: Right Arm, Patient Position: Sitting, Cuff Size: Normal)   Pulse 78   Temp 98.1 F (36.7 C) (Oral)   Ht 5' 10"  (1.778 m)   Wt 160 lb 3.2 oz (72.7 kg)   SpO2 95%   BMI 22.99 kg/m  Gen: NAD, resting comfortably Tympanic membrane's normal, oropharynx normal CV: RRR no murmurs rubs or gallops Lungs: CTAB no crackles, wheeze, rhonchi Abdomen: soft/nontender/nondistended/normal bowel sounds.  Ext: no edema Skin: warm, dry Neuro: Normal gait and speech    Assessment and Plan   #Hypertension S: Remains diet and exercise controlled. Still exercising 5 days a week- feels like he can do what he could do 30 years ago.  Home monitoring averaging 110s over 70s- in last 5 months it will occasionally get up into the 120s  A/P: Stable. Continue without medications.     #Hyperlipidemia/CAD - follows with Dr. Stanford Breed S: Compliant with atorvastatin 80 mg daily. Also takes aspirin 62m.  A/P: Stable and asymptomatic. Continue current medications.   #GERD S: Compliant with Protonix 40 mg every 3 days.  He takes oral B12 supplement as well. A/P: Stable. Continue current medications.   Other notes:  1.remains gluten free- a lot of good options out there now and wife has done an amazing job preparing foods for him.  Asks about ferritin level as taking iron once a day still.  2. Visiting with urology once a year- PSA over 4 has not increased- planning on yearly repeat 3. Right base of scrotum-5 mm subcutaneous round lesion from last year- he states completely went away.  Was shrinking at time of last visit  Return in about 1 year (around 04/12/2019) for follow up- or sooner if needed.  Lab/Order associations: FASTING Essential hypertension - Plan: CBC  Hyperlipidemia, unspecified hyperlipidemia type - Plan: CBC  Gastroesophageal reflux disease without esophagitis - Plan: Vitamin  B12  Atherosclerosis of coronary artery bypass graft of native heart without angina pectoris  Celiac sprue - Plan: IBC + Ferritin  High risk medication use - Plan: Vitamin B12, IBC + Ferritin  Return precautions advised.  Garret Reddish, MD

## 2018-09-26 ENCOUNTER — Other Ambulatory Visit: Payer: Self-pay | Admitting: Family Medicine

## 2018-10-28 DIAGNOSIS — D2271 Melanocytic nevi of right lower limb, including hip: Secondary | ICD-10-CM | POA: Diagnosis not present

## 2018-10-28 DIAGNOSIS — L821 Other seborrheic keratosis: Secondary | ICD-10-CM | POA: Diagnosis not present

## 2018-10-28 DIAGNOSIS — D225 Melanocytic nevi of trunk: Secondary | ICD-10-CM | POA: Diagnosis not present

## 2018-10-28 DIAGNOSIS — D1801 Hemangioma of skin and subcutaneous tissue: Secondary | ICD-10-CM | POA: Diagnosis not present

## 2018-10-28 DIAGNOSIS — D485 Neoplasm of uncertain behavior of skin: Secondary | ICD-10-CM | POA: Diagnosis not present

## 2018-10-28 DIAGNOSIS — L570 Actinic keratosis: Secondary | ICD-10-CM | POA: Diagnosis not present

## 2018-10-28 DIAGNOSIS — Z85828 Personal history of other malignant neoplasm of skin: Secondary | ICD-10-CM | POA: Diagnosis not present

## 2018-10-28 DIAGNOSIS — C44519 Basal cell carcinoma of skin of other part of trunk: Secondary | ICD-10-CM | POA: Diagnosis not present

## 2018-10-28 DIAGNOSIS — D2272 Melanocytic nevi of left lower limb, including hip: Secondary | ICD-10-CM | POA: Diagnosis not present

## 2018-10-28 DIAGNOSIS — L309 Dermatitis, unspecified: Secondary | ICD-10-CM | POA: Diagnosis not present

## 2018-11-23 ENCOUNTER — Telehealth: Payer: Self-pay | Admitting: Cardiology

## 2018-11-23 NOTE — Telephone Encounter (Signed)
New message   Patient states that needs a new prescription for sildenafil (VIAGRA) 50 MG tablet sent [id number] [place]

## 2018-11-23 NOTE — Telephone Encounter (Signed)
Okay to refill? Thanks!

## 2018-11-24 MED ORDER — SILDENAFIL CITRATE 50 MG PO TABS: 50.0000 mg | ORAL_TABLET | Freq: Every day | ORAL | 2 refills | Status: DC | PRN

## 2018-11-24 NOTE — Telephone Encounter (Signed)
Refill sent to the pharmacy electronically.  

## 2018-11-24 NOTE — Telephone Encounter (Signed)
Ok to refill [employer]

## 2018-11-26 ENCOUNTER — Encounter: Payer: Self-pay | Admitting: Family Medicine

## 2018-11-26 ENCOUNTER — Other Ambulatory Visit: Payer: Self-pay

## 2018-11-26 ENCOUNTER — Ambulatory Visit (INDEPENDENT_AMBULATORY_CARE_PROVIDER_SITE_OTHER): Payer: Medicare Other

## 2018-11-26 DIAGNOSIS — Z23 Encounter for immunization: Secondary | ICD-10-CM | POA: Diagnosis not present

## 2018-12-07 ENCOUNTER — Encounter: Payer: Self-pay | Admitting: Family Medicine

## 2018-12-30 ENCOUNTER — Ambulatory Visit (HOSPITAL_COMMUNITY)
Admission: RE | Admit: 2018-12-30 | Discharge: 2018-12-30 | Disposition: A | Discharge status: Home / Self Care | Payer: Medicare Other | Source: Ambulatory Visit | Attending: Cardiology | Admitting: Cardiology

## 2018-12-30 ENCOUNTER — Other Ambulatory Visit: Payer: Self-pay | Admitting: *Deleted

## 2018-12-30 ENCOUNTER — Other Ambulatory Visit: Payer: Self-pay

## 2018-12-30 DIAGNOSIS — I714 Abdominal aortic aneurysm, without rupture, unspecified: Secondary | ICD-10-CM

## 2019-01-19 ENCOUNTER — Telehealth: Payer: Self-pay | Admitting: Family Medicine

## 2019-01-19 NOTE — Telephone Encounter (Signed)
I left a message asking the patient and spouse to call and schedule Medicare AWV with Loma Sousa ([place]).  If patient calls back, please schedule Medicare Wellness Visit (initial) at next available opening.  VDM (Dee-Dee)

## 2019-01-31 ENCOUNTER — Other Ambulatory Visit: Payer: Self-pay | Admitting: *Deleted

## 2019-01-31 MED ORDER — ATORVASTATIN CALCIUM 80 MG PO TABS: 80.0000 mg | ORAL_TABLET | Freq: Every day | ORAL | 3 refills | Status: DC

## 2019-02-14 NOTE — Progress Notes (Signed)
HPI: FU coronary artery disease status post coronary bypassing graft in January 2009. Note at that time, he had a LIMA to the LAD, free RIMA to the obtuse marginal, and sequential saphenous vein graft to the first and second diagonals, as well as saphenous vein graft to the PDA. Note preoperative carotid Dopplers performed revealed no significant right or left internal carotid artery stenosis. Nuclear study 5/14 showed EF 63 with normal perfusion. Abd ultrasound 11/19 showed 3.2 cm abdominal aortic aneurysm.  Follow-up study November 2020 showed no aneurysm.  Since I last saw him,the patient denies any dyspnea on exertion, orthopnea, PND, pedal edema, palpitations, syncope or chest pain.   Current Outpatient Medications  Medication Sig Dispense Refill  . aspirin EC 81 MG tablet Take 1 tablet (81 mg total) by mouth daily.    Marland Kitchen atorvastatin (LIPITOR) 80 MG tablet Take 1 tablet (80 mg total) by mouth daily. 90 tablet 3  . calcium citrate-vitamin D (CITRACAL+D) 315-200 MG-UNIT per tablet 2 tablets. 2 tabs at bedtime    . Cholecalciferol (VITAMIN D3) 2000 UNITS TABS Take 1 tablet by mouth.     . Ferrous Sulfate Dried (IRON) 160 (50 FE) MG TBCR Take 1 tablet by mouth daily.     . magnesium oxide (MAG-OX) 400 MG tablet Take 400 mg by mouth 2 (two) times daily.    . Multiple Vitamin (MULTIVITAMIN) tablet Take 2 tablets by mouth 2 (two) times daily.     . Omega-3 Fatty Acids (OMEGA 3 PO) Take 2 capsules (3000 mg total) by mouth in the morning and take 1 capsule (1500 mg total) by mouth in the evening.    . pantoprazole (PROTONIX) 40 MG tablet Take 40 mg by mouth. ONCE EVERY THREE DAYS    . sildenafil (VIAGRA) 50 MG tablet Take 1 tablet (50 mg total) by mouth daily as needed for erectile dysfunction. 10 tablet 2  . vitamin B-12 (CYANOCOBALAMIN) 500 MCG tablet Take 500 mcg by mouth daily.     No current facility-administered medications for this visit.     Past Medical History:  Diagnosis Date   . CAD (coronary artery disease)   . Celiac sprue   . GERD (gastroesophageal reflux disease)   . Hyperlipidemia   . Low back pain    in 1980s back would "go out" about once a year. got better after brace in 1985.  . OTITIS EXTERNA, ACUTE, LEFT     Past Surgical History:  Procedure Laterality Date  . APPENDECTOMY  1973  . CORONARY ARTERY BYPASS GRAFT  2009   5 arteries  . NASAL SEPTOPLASTY W/ TURBINOPLASTY  1985   deviated septum  . SHOULDER SURGERY Left 1962  . TONSILECTOMY, ADENOIDECTOMY, BILATERAL MYRINGOTOMY AND TUBES  1957    Social History   Socioeconomic History  . Marital status: Married    Spouse name: Not on file  . Number of children: Not on file  . Years of education: Not on file  . Highest education level: Not on file  Occupational History  . Not on file  Tobacco Use  . Smoking status: Never Smoker  . Smokeless tobacco: Never Used  Substance and Sexual Activity  . Alcohol use: Yes    Alcohol/week: 1.0 standard drinks    Types: 1 Standard drinks or equivalent per week  . Drug use: No  . Sexual activity: Not on file  Other Topics Concern  . Not on file  Social History Narrative   Married 68  years in 2016. No children. 8 dogs- chihuahua and mixes of chihuahua      [employer]. 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 Plans to work until death but may cut hours back or maybe stop around 89. Minimum 74.       Hobbies: time with wife and dogs   Exercise in evenings- walk or run (limits due to knees), exercise bike and treadmill- 5x a week      Social Determinants of Health   Financial Resource Strain:   . Difficulty of Paying Living Expenses: Not on file  Food Insecurity:   . Worried About Charity fundraiser in the Last Year: Not on file  . Ran Out of Food in the Last Year: Not on file  Transportation Needs:   . Lack of Transportation (Medical): Not on file  . Lack of Transportation (Non-Medical):  Not on file  Physical Activity:   . Days of Exercise per Week: Not on file  . Minutes of Exercise per Session: Not on file  Stress:   . Feeling of Stress : Not on file  Social Connections:   . Frequency of Communication with Friends and Family: Not on file  . Frequency of Social Gatherings with Friends and Family: Not on file  . Attends Religious Services: Not on file  . Active Member of Clubs or Organizations: Not on file  . Attends Archivist Meetings: Not on file  . Marital Status: Not on file  Intimate Partner Violence:   . Fear of Current or Ex-Partner: Not on file  . Emotionally Abused: Not on file  . Physically Abused: Not on file  . Sexually Abused: Not on file    Family History  Problem Relation Age of Onset  . Hypertension Mother   . Heart attack Mother   . Heart disease Father        CABG, uncles as well  . Parkinson's disease Father        dementia potentially from parkinsons  . Hypertension Father   . Colon cancer Father 71  . Hemochromatosis Brother     ROS: no fevers or chills, productive cough, hemoptysis, dysphasia, odynophagia, melena, hematochezia, dysuria, hematuria, rash, seizure activity, orthopnea, PND, pedal edema, claudication. Remaining systems are negative.  Physical Exam: Well-developed well-nourished in no acute distress.  Skin is warm and dry.  HEENT is normal.  Neck is supple.  Chest is clear to auscultation with normal expansion.  Cardiovascular exam is regular rate and rhythm.  Abdominal exam nontender or distended. No masses palpated. Extremities show no edema. neuro grossly intact  ECG-sinus rhythm at a rate of 74, right bundle branch block.  Personally reviewed  A/P  1 coronary artery disease status post coronary artery bypass and graft-patient has not had recurrent symptoms of chest pain.  Continue medical therapy including aspirin and statin.  2 abdominal aortic aneurysm-not noted on most recent study but evident on  prior.  Will repeat study November 2021.  3 hyperlipidemia-continue Lipitor.  Check lipids and liver.  4 elevated blood pressure reading-he follows this closely at home and it is typically controlled.  Continue to monitor and adjust if needed.  Kirk Ruths, MD

## 2019-02-15 ENCOUNTER — Telehealth: Payer: Self-pay | Admitting: Cardiology

## 2019-02-15 NOTE — Telephone Encounter (Signed)
Patient states he needs an order for his lipid panel before his appointment 1/11.

## 2019-02-15 NOTE — Telephone Encounter (Signed)
Pt has early appt will just skip breakfast that morning and will have labs done after appt  and call later that day or following day  with results Pt agrees with plan ./cy

## 2019-02-21 ENCOUNTER — Other Ambulatory Visit: Payer: Self-pay

## 2019-02-21 ENCOUNTER — Ambulatory Visit (INDEPENDENT_AMBULATORY_CARE_PROVIDER_SITE_OTHER): Payer: Medicare Other | Admitting: Cardiology

## 2019-02-21 ENCOUNTER — Encounter: Payer: Self-pay | Admitting: Cardiology

## 2019-02-21 VITALS — BP 138/88 | HR 74 | Ht 70.0 in | Wt 159.6 lb

## 2019-02-21 DIAGNOSIS — E7800 Pure hypercholesterolemia, unspecified: Secondary | ICD-10-CM | POA: Diagnosis not present

## 2019-02-21 DIAGNOSIS — I714 Abdominal aortic aneurysm, without rupture, unspecified: Secondary | ICD-10-CM

## 2019-02-21 DIAGNOSIS — I2510 Atherosclerotic heart disease of native coronary artery without angina pectoris: Secondary | ICD-10-CM | POA: Diagnosis not present

## 2019-02-21 NOTE — Patient Instructions (Signed)
Medication Instructions:  NO CHANGE *If you need a refill on your cardiac medications before your next appointment, please call your pharmacy*  Lab Work: Your physician recommends that you HAVE LAB WORK TODAY If you have labs (blood work) drawn today and your tests are completely normal, you will receive your results only by: Marland Kitchen MyChart Message (if you have MyChart) OR . A paper copy in the mail If you have any lab test that is abnormal or we need to change your treatment, we will call you to review the results.  Follow-Up: At [facility], you and your health needs are our priority.  As part of our continuing mission to provide you with exceptional heart care, we have created designated Provider Care Teams.  These Care Teams include your primary Cardiologist (physician) and Advanced Practice Providers (APPs -  Physician Assistants and Nurse Practitioners) who all work together to provide you with the care you need, when you need it.  Your next appointment:   12 month(s)  The format for your next appointment:   Either In Person or Virtual  Provider:   You may see Kirk Ruths MD or one of the following Advanced Practice Providers on your designated Care Team:    Kerin Ransom, PA-C  [place], [place]  Coletta Memos, [place]

## 2019-02-22 LAB — HEPATIC FUNCTION PANEL
ALT: 18 IU/L (ref 0–44)
AST: 23 IU/L (ref 0–40)
Albumin: 4.8 g/dL (ref 3.8–4.8)
Alkaline Phosphatase: 83 IU/L (ref 39–117)
Bilirubin Total: 1 mg/dL (ref 0.0–1.2)
Bilirubin, Direct: 0.27 mg/dL (ref 0.00–0.40)
Total Protein: 7.2 g/dL (ref 6.0–8.5)

## 2019-02-22 LAB — LIPID PANEL
Chol/HDL Ratio: 2.5 ratio (ref 0.0–5.0)
Cholesterol, Total: 126 mg/dL (ref 100–199)
HDL: 51 mg/dL (ref >39)
LDL Chol Calc (NIH): 56 mg/dL (ref 0–99)
Triglycerides: 103 mg/dL (ref 0–149)
VLDL Cholesterol Cal: 19 mg/dL (ref 5–40)

## 2019-03-04 ENCOUNTER — Ambulatory Visit (INDEPENDENT_AMBULATORY_CARE_PROVIDER_SITE_OTHER): Payer: Medicare Other

## 2019-03-04 ENCOUNTER — Other Ambulatory Visit: Payer: Self-pay

## 2019-03-04 VITALS — BP 132/70 | HR 93 | Temp 97.9°F | Ht 70.0 in | Wt 161.6 lb

## 2019-03-04 DIAGNOSIS — Z0000 Encounter for general adult medical examination without abnormal findings: Secondary | ICD-10-CM

## 2019-03-04 NOTE — Patient Instructions (Addendum)
[name] , Thank you for taking time to come for your Medicare Wellness Visit. I appreciate your ongoing commitment to your health goals. Please review the following plan we discussed and let me know if I can assist you in the future.   Screening recommendations/referrals: Colorectal Screening: up to date; last colonoscopy 04/06/15 (repeat due 2022)  Vision and Dental Exams: Recommended annual ophthalmology exams for early detection of glaucoma and other disorders of the eye Recommended annual dental exams for proper oral hygiene  Vaccinations: Influenza vaccine: completed 11/26/18 Pneumococcal vaccine: up to date; last 04/06/17 Tdap vaccine: up to date; last 04/14/18   Shingles vaccine: Shingrix completed    Advanced directives: Please bring a copy of your POA (Power of [place]) and/or Living Will to your next appointment.  Goals: Recommend to drink at least 6-8 8oz glasses of water per day and consume a balanced diet rich in fresh fruits and vegetables.    Next appointment: Please schedule your Annual Wellness Visit with your Nurse Health Advisor in one year.  Preventive Care 29 Years and Older, Male Preventive care refers to lifestyle choices and visits with your health care provider that can promote health and wellness. What does preventive care include?  A yearly physical exam. This is also called an annual well check.  Dental exams once or twice a year.  Routine eye exams. Ask your health care provider how often you should have your eyes checked.  Personal lifestyle choices, including:  Daily care of your teeth and gums.  Regular physical activity.  Eating a healthy diet.  Avoiding tobacco and drug use.  Limiting alcohol use.  Practicing safe sex.  Taking low doses of aspirin every day if recommended by your health care provider..  Taking vitamin and mineral supplements as recommended by your health care provider. What happens during an annual well check? The  services and screenings done by your health care provider during your annual well check will depend on your age, overall health, lifestyle risk factors, and family history of disease. Counseling  Your health care provider may ask you questions about your:  Alcohol use.  Tobacco use.  Drug use.  Emotional well-being.  Home and relationship well-being.  Sexual activity.  Eating habits.  History of falls.  Memory and ability to understand (cognition).  Work and work Statistician. Screening  You may have the following tests or measurements:  Height, weight, and BMI.  Blood pressure.  Lipid and cholesterol levels. These may be checked every 5 years, or more frequently if you are over 60 years old.  Skin check.  Lung cancer screening. You may have this screening every year starting at age 55 if you have a 30-pack-year history of smoking and currently smoke or have quit within the past 15 years.  Fecal occult blood test (FOBT) of the stool. You may have this test every year starting at age 21.  Flexible sigmoidoscopy or colonoscopy. You may have a sigmoidoscopy every 5 years or a colonoscopy every 10 years starting at age 18.  Prostate cancer screening. Recommendations will vary depending on your family history and other risks.  Hepatitis C blood test.  Hepatitis B blood test.  Sexually transmitted disease (STD) testing.  Diabetes screening. This is done by checking your blood sugar (glucose) after you have not eaten for a while (fasting). You may have this done every 1-3 years.  Abdominal aortic aneurysm (AAA) screening. You may need this if you are a current or former smoker.  Osteoporosis. You may be screened starting at age 65 if you are at high risk. Talk with your health care provider about your test results, treatment options, and if necessary, the need for more tests. Vaccines  Your health care provider may recommend certain vaccines, such as:  Influenza  vaccine. This is recommended every year.  Tetanus, diphtheria, and acellular pertussis (Tdap, Td) vaccine. You may need a Td booster every 10 years.  Zoster vaccine. You may need this after age 41.  Pneumococcal 13-valent conjugate (PCV13) vaccine. One dose is recommended after age 23.  Pneumococcal polysaccharide (PPSV23) vaccine. One dose is recommended after age 78. Talk to your health care provider about which screenings and vaccines you need and how often you need them. This information is not intended to replace advice given to you by your health care provider. Make sure you discuss any questions you have with your health care provider. Document Released: 02/23/2015 Document Revised: 10/17/2015 Document Reviewed: 11/28/2014 Elsevier Interactive Patient Education  2017 [place] Prevention in the Home Falls can cause injuries. They can happen to people of all ages. There are many things you can do to make your home safe and to help prevent falls. What can I do on the outside of my home?  Regularly fix the edges of walkways and driveways and fix any cracks.  Remove anything that might make you trip as you walk through a door, such as a raised step or threshold.  Trim any bushes or trees on the path to your home.  Use bright outdoor lighting.  Clear any walking paths of anything that might make someone trip, such as rocks or tools.  Regularly check to see if handrails are loose or broken. Make sure that both sides of any steps have handrails.  Any raised decks and porches should have guardrails on the edges.  Have any leaves, snow, or ice cleared regularly.  Use sand or salt on walking paths during winter.  Clean up any spills in your garage right away. This includes oil or grease spills. What can I do in the bathroom?  Use night lights.  Install grab bars by the toilet and in the tub and shower. Do not use towel bars as grab bars.  Use non-skid mats or decals  in the tub or shower.  If you need to sit down in the shower, use a plastic, non-slip stool.  Keep the floor dry. Clean up any water that spills on the floor as soon as it happens.  Remove soap buildup in the tub or shower regularly.  Attach bath mats securely with double-sided non-slip rug tape.  Do not have throw rugs and other things on the floor that can make you trip. What can I do in the bedroom?  Use night lights.  Make sure that you have a light by your bed that is easy to reach.  Do not use any sheets or blankets that are too big for your bed. They should not hang down onto the floor.  Have a firm chair that has side arms. You can use this for support while you get dressed.  Do not have throw rugs and other things on the floor that can make you trip. What can I do in the kitchen?  Clean up any spills right away.  Avoid walking on wet floors.  Keep items that you use a lot in easy-to-reach places.  If you need to reach something above you, use a strong step  stool that has a grab bar.  Keep electrical cords out of the way.  Do not use floor polish or wax that makes floors slippery. If you must use wax, use non-skid floor wax.  Do not have throw rugs and other things on the floor that can make you trip. What can I do with my stairs?  Do not leave any items on the stairs.  Make sure that there are handrails on both sides of the stairs and use them. Fix handrails that are broken or loose. Make sure that handrails are as long as the stairways.  Check any carpeting to make sure that it is firmly attached to the stairs. Fix any carpet that is loose or worn.  Avoid having throw rugs at the top or bottom of the stairs. If you do have throw rugs, attach them to the floor with carpet tape.  Make sure that you have a light switch at the top of the stairs and the bottom of the stairs. If you do not have them, ask someone to add them for you. What else can I do to help  prevent falls?  Wear shoes that:  Do not have high heels.  Have rubber bottoms.  Are comfortable and fit you well.  Are closed at the toe. Do not wear sandals.  If you use a stepladder:  Make sure that it is fully opened. Do not climb a closed stepladder.  Make sure that both sides of the stepladder are locked into place.  Ask someone to hold it for you, if possible.  Clearly mark and make sure that you can see:  Any grab bars or handrails.  First and last steps.  Where the edge of each step is.  Use tools that help you move around (mobility aids) if they are needed. These include:  Canes.  Walkers.  Scooters.  Crutches.  Turn on the lights when you go into a dark area. Replace any light bulbs as soon as they burn out.  Set up your furniture so you have a clear path. Avoid moving your furniture around.  If any of your floors are uneven, fix them.  If there are any pets around you, be aware of where they are.  Review your medicines with your doctor. Some medicines can make you feel dizzy. This can increase your chance of falling. Ask your doctor what other things that you can do to help prevent falls. This information is not intended to replace advice given to you by your health care provider. Make sure you discuss any questions you have with your health care provider. Document Released: 11/23/2008 Document Revised: 07/05/2015 Document Reviewed: 03/03/2014 Elsevier Interactive Patient Education  2017 [employer].

## 2019-03-04 NOTE — Progress Notes (Signed)
Subjective:   [name] is a 69 y.o. male who presents for Medicare Annual/Subsequent preventive examination.  Review of Systems:   Cardiac Risk Factors include: advanced age (>42mn, >>19women);male gender;dyslipidemia    Objective:    Vitals: BP 132/70   Pulse 93   Temp 97.9 F (36.6 C) (Temporal)   Ht 5' 10"  (1.778 m)   Wt 161 lb 9.6 oz (73.3 kg)   SpO2 97%   BMI 23.19 kg/m   Body mass index is 23.19 kg/m.  Advanced Directives 03/04/2019 03/23/2015  Does Patient Have a Medical Advance Directive? Yes Yes  Type of Advance Directive Living will;Healthcare Power of Attorney Living will;Healthcare Power of Attorney  Does patient want to make changes to medical advance directive? No - Patient declined -  Copy of [place] Chart? No - copy requested -    Tobacco Social History   Tobacco Use  Smoking Status Never Smoker  Smokeless Tobacco Never Used     Counseling given: Not Answered   Clinical Intake:  Pre-visit preparation completed: Yes  Pain : No/denies pain  Diabetes: No  How often do you need to have someone help you when you read instructions, pamphlets, or other written materials from your doctor or pharmacy?: 1 - Never  Interpreter Needed?: No  Information entered by :: CDenman GeorgeLPN  Past Medical History:  Diagnosis Date  . CAD (coronary artery disease)   . Celiac sprue   . GERD (gastroesophageal reflux disease)   . Hyperlipidemia   . Low back pain    in 1980s back would "go out" about once a year. got better after brace in 1985.  . OTITIS EXTERNA, ACUTE, LEFT    Past Surgical History:  Procedure Laterality Date  . APPENDECTOMY  1973  . CORONARY ARTERY BYPASS GRAFT  2009   5 arteries  . NASAL SEPTOPLASTY W/ TURBINOPLASTY  1985   deviated septum  . SHOULDER SURGERY Left 1962  . TONSILECTOMY, ADENOIDECTOMY, BILATERAL MYRINGOTOMY AND TUBES  1957   Family History  Problem Relation Age of Onset  . Hypertension  Mother   . Heart attack Mother   . Heart disease Father        CABG, uncles as well  . Parkinson's disease Father        dementia potentially from parkinsons  . Hypertension Father   . Colon cancer Father 670 . Hemochromatosis Brother    Social History   Socioeconomic History  . Marital status: Married    Spouse name: Not on file  . Number of children: Not on file  . Years of education: Not on file  . Highest education level: Not on file  Occupational History  . Not on file  Tobacco Use  . Smoking status: Never Smoker  . Smokeless tobacco: Never Used  Substance and Sexual Activity  . Alcohol use: Yes    Alcohol/week: 1.0 standard drinks    Types: 1 Standard drinks or equivalent per week  . Drug use: No  . Sexual activity: Not on file  Other Topics Concern  . Not on file  Social History Narrative   Married 44 years in 2016. No children. 8 dogs- chihuahua and mixes of chihuahua      [employer]. 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       Hobbies: time  with wife and dogs   Exercise in evenings- walk or run (limits due to knees), exercise bike and treadmill- 5x a week      Social Determinants of Health   Financial Resource Strain:   . Difficulty of Paying Living Expenses: Not on file  Food Insecurity:   . Worried About Charity fundraiser in the Last Year: Not on file  . Ran Out of Food in the Last Year: Not on file  Transportation Needs:   . Lack of Transportation (Medical): Not on file  . Lack of Transportation (Non-Medical): Not on file  Physical Activity:   . Days of Exercise per Week: Not on file  . Minutes of Exercise per Session: Not on file  Stress:   . Feeling of Stress : Not on file  Social Connections:   . Frequency of Communication with Friends and Family: Not on file  . Frequency of Social Gatherings with  Friends and Family: Not on file  . Attends Religious Services: Not on file  . Active Member of Clubs or Organizations: Not on file  . Attends Archivist Meetings: Not on file  . Marital Status: Not on file    Outpatient Encounter Medications as of 03/04/2019  Medication Sig  . aspirin EC 81 MG tablet Take 1 tablet (81 mg total) by mouth daily.  Marland Kitchen atorvastatin (LIPITOR) 80 MG tablet Take 1 tablet (80 mg total) by mouth daily.  . calcium citrate-vitamin D (CITRACAL+D) 315-200 MG-UNIT per tablet 2 tablets. 2 tabs at bedtime  . Cholecalciferol (VITAMIN D3) 2000 UNITS TABS Take 1 tablet by mouth.   . Ferrous Sulfate Dried (IRON) 160 (50 FE) MG TBCR Take 1 tablet by mouth daily.   . magnesium oxide (MAG-OX) 400 MG tablet Take 400 mg by mouth 2 (two) times daily.  . Multiple Vitamin (MULTIVITAMIN) tablet Take 2 tablets by mouth 2 (two) times daily.   . Omega-3 Fatty Acids (OMEGA 3 PO) Take 2 capsules (3000 mg total) by mouth in the morning and take 1 capsule (1500 mg total) by mouth in the evening.  . pantoprazole (PROTONIX) 40 MG tablet Take 40 mg by mouth. ONCE EVERY THREE DAYS  . sildenafil (VIAGRA) 50 MG tablet Take 1 tablet (50 mg total) by mouth daily as needed for erectile dysfunction.  . vitamin B-12 (CYANOCOBALAMIN) 500 MCG tablet Take 500 mcg by mouth daily.   No facility-administered encounter medications on file as of 03/04/2019.    Activities of Daily Living In your present state of health, do you have any difficulty performing the following activities: 03/04/2019 04/12/2018  Hearing? N N  Vision? N N  Difficulty concentrating or making decisions? N N  Walking or climbing stairs? N N  Dressing or bathing? N N  Doing errands, shopping? N N  Preparing Food and eating ? N -  Using the Toilet? N -  In the past six months, have you accidently leaked urine? N -  Do you have problems with loss of bowel control? N -  Managing your Medications? N -  Managing your Finances? N -    Housekeeping or managing your Housekeeping? N -  Some recent data might be hidden    Patient Care Team: Marin Olp, MD as PCP - General (Family Medicine) Stanford Breed Denice Bors, MD as Consulting Physician (Cardiology) Danella Sensing, MD as Consulting Physician (Dermatology)   Assessment:   This is a routine wellness examination for [place].  Exercise Activities and Dietary recommendations Current  Exercise Habits: Home exercise routine, Type of exercise: treadmill;walking(stationary bike), Time (Minutes): 45, Frequency (Times/Week): 5, Weekly Exercise (Minutes/Week): 225, Intensity: Moderate  Goals   None     Fall Risk Fall Risk  03/04/2019 04/12/2018 10/27/2016 01/19/2015  Falls in the past year? 0 0 No No  Number falls in past yr: 0 0 - -  Injury with Fall? 0 0 - -  Follow up Falls evaluation completed;Education provided;Falls prevention discussed - - -   Is the patient's home free of loose throw rugs in walkways, pet beds, electrical cords, etc?   yes      Grab bars in the bathroom? yes      Handrails on the stairs?   yes      Adequate lighting?   yes  Timed Get Up and Go Performed: completed and within normal timeframe; no gait abnormalities noted    Depression Screen PHQ 2/9 Scores 03/04/2019 04/12/2018 10/27/2016 01/19/2015  PHQ - 2 Score 0 0 0 0    Cognitive Function     6CIT Screen 03/04/2019  What Year? 0 points  What month? 0 points  What time? 0 points  Count back from 20 0 points  Months in reverse 0 points  Repeat phrase 0 points  Total Score 0    Immunization History  Administered Date(s) Administered  . Fluad Quad(high Dose 65+) 11/26/2018  . Influenza, High Dose Seasonal PF 12/21/2015, 11/24/2016, 12/01/2017  . Influenza,inj,Quad PF,6+ Mos 01/05/2013, 11/24/2013, 12/28/2014  . Pneumococcal Conjugate-13 01/19/2015  . Pneumococcal Polysaccharide-23 04/06/2017  . Td 06/14/1996, 02/19/2007  . Tdap 04/14/2018  . Zoster 06/07/2012  . Zoster Recombinat  (Shingrix) 06/20/2017, 11/28/2017    Qualifies for Shingles Vaccine? Shingrix completed   Screening Tests Health Maintenance  Topic Date Due  . COLONOSCOPY  04/05/2020  . TETANUS/TDAP  04/13/2028  . INFLUENZA VACCINE  Completed  . Hepatitis C Screening  Completed  . PNA vac Low Risk Adult  Completed   Cancer Screenings: Lung: Low Dose CT Chest recommended if Age 13-80 years, 30 pack-year currently smoking OR have quit w/in 15years. Patient does not qualify. Colorectal: colonoscopy 04/06/15 (repeat due 03/2020)     Plan:  I have personally reviewed and addressed the Medicare Annual Wellness questionnaire and have noted the following in the patient's chart:  A. Medical and social history B. Use of alcohol, tobacco or illicit drugs  C. Current medications and supplements D. Functional ability and status E.  Nutritional status F.  Physical activity G. Advance directives H. List of other physicians I.  Hospitalizations, surgeries, and ER visits in previous 12 months J.  [place] such as hearing and vision if needed, cognitive and depression L. Referrals, records requested, and appointments- none   In addition, I have reviewed and discussed with patient certain preventive protocols, quality metrics, and best practice recommendations. A written personalized care plan for preventive services as well as general preventive health recommendations were provided to patient.   Signed,  Denman George, LPN  Nurse Health Advisor   Nurse Notes: no additional

## 2019-04-03 ENCOUNTER — Ambulatory Visit: Payer: Medicare Other | Attending: Internal Medicine

## 2019-04-03 DIAGNOSIS — Z23 Encounter for immunization: Secondary | ICD-10-CM | POA: Insufficient documentation

## 2019-04-03 NOTE — Progress Notes (Signed)
   Covid-19 Vaccination Clinic  Name:  [name]    MRN: [id number] DOB: 01/15/1951  04/03/2019  [name] was observed post Covid-19 immunization for 15 minutes without incidence. He was provided with Vaccine Information Sheet and instruction to access the V-Safe system.   [name] was instructed to call 911 with any severe reactions post vaccine: Marland Kitchen Difficulty breathing  . Swelling of your face and throat  . A fast heartbeat  . A bad rash all over your body  . Dizziness and weakness    Immunizations Administered    Name Date Dose VIS Date Route   Pfizer COVID-19 Vaccine 04/03/2019  1:18 PM 0.3 mL 01/21/2019 Intramuscular   Manufacturer: [place]   Lot: [id number]   [place]: [id number]

## 2019-04-27 ENCOUNTER — Ambulatory Visit: Payer: Medicare Other | Attending: Internal Medicine

## 2019-04-27 DIAGNOSIS — Z23 Encounter for immunization: Secondary | ICD-10-CM

## 2019-04-27 NOTE — Progress Notes (Signed)
   [facility]PO[facility]-18 Vaccination [facility]  Name:  Vidal Lampkins.    MRN: [id number] DOB: 23-Aug-1950  04/27/2019  Mr. Egnor was observed post Covid-19 immunization for 15 minutes without incident. He was provided with Vaccine Information Sheet and instruction to access the V-Safe system.   Mr. Chestnut was instructed to call 911 with any severe reactions post vaccine: Marland Kitchen Difficulty breathing  . Swelling of face and throat  . A fast heartbeat  . A bad rash all over body  . Dizziness and weakness   Immunizations Administered    Name Date Dose VIS Date Route   Pfizer COVID-19 Vaccine 04/27/2019  1:09 PM 0.3 mL 01/21/2019 Intramuscular   Manufacturer: [place]   Lot: [id number]   [place]: [id number]

## 2019-06-19 ENCOUNTER — Other Ambulatory Visit: Payer: Self-pay | Admitting: Family Medicine

## 2019-11-14 DIAGNOSIS — Z23 Encounter for immunization: Secondary | ICD-10-CM | POA: Diagnosis not present

## 2019-11-21 DIAGNOSIS — Z23 Encounter for immunization: Secondary | ICD-10-CM | POA: Diagnosis not present

## 2019-12-12 ENCOUNTER — Telehealth: Payer: Self-pay | Admitting: Cardiology

## 2019-12-12 DIAGNOSIS — E7800 Pure hypercholesterolemia, unspecified: Secondary | ICD-10-CM

## 2019-12-12 NOTE — Telephone Encounter (Signed)
°  Patient has scheduled his annual follow up with Dr Stanford Breed in February but he would like to know if he should come in for labs to test his lipids before the appointment. The lipid order I saw has expired. Please advise patient if he needs to come in before appointment.

## 2019-12-12 NOTE — Telephone Encounter (Signed)
Called and spoke to patient. He states that he has a follow up appointment with Dr. Stanford Breed in February and he usually gets fasting bloodwork before the appointment. LIPIDS and LFTs have been ordered.

## 2019-12-30 ENCOUNTER — Ambulatory Visit (HOSPITAL_COMMUNITY)
Admission: RE | Admit: 2019-12-30 | Discharge: 2019-12-30 | Disposition: A | Discharge status: Home / Self Care | Payer: Medicare Other | Source: Ambulatory Visit | Attending: Cardiology | Admitting: Cardiology

## 2019-12-30 ENCOUNTER — Other Ambulatory Visit: Payer: Self-pay

## 2019-12-30 DIAGNOSIS — I714 Abdominal aortic aneurysm, without rupture, unspecified: Secondary | ICD-10-CM

## 2020-01-09 ENCOUNTER — Encounter: Payer: Self-pay | Admitting: *Deleted

## 2020-01-21 ENCOUNTER — Other Ambulatory Visit: Payer: Self-pay | Admitting: Cardiology

## 2020-01-25 DIAGNOSIS — H5212 Myopia, left eye: Secondary | ICD-10-CM | POA: Diagnosis not present

## 2020-01-25 DIAGNOSIS — H524 Presbyopia: Secondary | ICD-10-CM | POA: Diagnosis not present

## 2020-01-25 DIAGNOSIS — H02005 Unspecified entropion of left lower eyelid: Secondary | ICD-10-CM | POA: Diagnosis not present

## 2020-01-25 DIAGNOSIS — H18503 Unspecified hereditary corneal dystrophies, bilateral: Secondary | ICD-10-CM | POA: Diagnosis not present

## 2020-01-25 DIAGNOSIS — D3132 Benign neoplasm of left choroid: Secondary | ICD-10-CM | POA: Diagnosis not present

## 2020-01-25 DIAGNOSIS — H25013 Cortical age-related cataract, bilateral: Secondary | ICD-10-CM | POA: Diagnosis not present

## 2020-01-25 DIAGNOSIS — H5201 Hypermetropia, right eye: Secondary | ICD-10-CM | POA: Diagnosis not present

## 2020-01-25 DIAGNOSIS — H52223 Regular astigmatism, bilateral: Secondary | ICD-10-CM | POA: Diagnosis not present

## 2020-03-12 NOTE — Progress Notes (Signed)
HPI: FU coronary artery disease status post coronary bypassing graft in January 2009. Note at that time, he had a LIMA to the LAD, free RIMA to the obtuse marginal, and sequential saphenous vein graft to the first and second diagonals, as well as saphenous vein graft to the PDA. Note preoperative carotid Dopplers performed revealed no significant right or left internal carotid artery stenosis. Nuclear study 5/14 showed EF 63 with normal perfusion.Abd ultrasound 11/21 showed mildly dilated right common iliac; FU study as needed. Since I last saw him, there is no dyspnea, chest pain, palpitations or syncope.  Current Outpatient Medications  Medication Sig Dispense Refill  . aspirin EC 81 MG tablet Take 1 tablet (81 mg total) by mouth daily.    Marland Kitchen atorvastatin (LIPITOR) 80 MG tablet TAKE 1 TABLET BY MOUTH EVERY DAY 90 tablet 3  . calcium citrate-vitamin D (CITRACAL+D) 315-200 MG-UNIT per tablet 2 tablets. 2 tabs at bedtime    . Cholecalciferol (VITAMIN D3) 2000 UNITS TABS Take 1 tablet by mouth.     . Ferrous Sulfate Dried (IRON) 160 (50 FE) MG TBCR Take 1 tablet by mouth daily.    . magnesium oxide (MAG-OX) 400 MG tablet Take 400 mg by mouth 2 (two) times daily.    . Multiple Vitamin (MULTIVITAMIN) tablet Take 2 tablets by mouth 2 (two) times daily.    . Omega-3 Fatty Acids (OMEGA 3 PO) Take 2 capsules (3000 mg total) by mouth in the morning and take 1 capsule (1500 mg total) by mouth in the evening.    . pantoprazole (PROTONIX) 40 MG tablet TAKE 1 TABLET BY MOUTH EVERY OTHER DAY 45 tablet 2  . sildenafil (VIAGRA) 50 MG tablet Take 1 tablet (50 mg total) by mouth daily as needed for erectile dysfunction. 10 tablet 2  . vitamin B-12 (CYANOCOBALAMIN) 500 MCG tablet Take 500 mcg by mouth daily.     No current facility-administered medications for this visit.     Past Medical History:  Diagnosis Date  . CAD (coronary artery disease)   . Celiac sprue   . GERD (gastroesophageal reflux  disease)   . Hyperlipidemia   . Low back pain    in 1980s back would "go out" about once a year. got better after brace in 1985.  . OTITIS EXTERNA, ACUTE, LEFT     Past Surgical History:  Procedure Laterality Date  . APPENDECTOMY  1973  . CORONARY ARTERY BYPASS GRAFT  2009   5 arteries  . NASAL SEPTOPLASTY W/ TURBINOPLASTY  1985   deviated septum  . SHOULDER SURGERY Left 1962  . TONSILECTOMY, ADENOIDECTOMY, BILATERAL MYRINGOTOMY AND TUBES  1957    Social History   Socioeconomic History  . Marital status: Married    Spouse name: Not on file  . Number of children: Not on file  . Years of education: Not on file  . Highest education level: Not on file  Occupational History  . Not on file  Tobacco Use  . Smoking status: Never Smoker  . Smokeless tobacco: Never Used  Vaping Use  . Vaping Use: Never used  Substance and Sexual Activity  . Alcohol use: Yes    Alcohol/week: 1.0 standard drink    Types: 1 Standard drinks or equivalent per week  . Drug use: No  . Sexual activity: Not on file  Other Topics Concern  . Not on file  Social History Narrative   Married 44 years in 2016. No children. 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 Minimum 74.       Hobbies: time with wife and dogs   Exercise in evenings- walk or run (limits due to knees), exercise bike and treadmill- 5x a week      Social Determinants of Health   Financial Resource Strain: Not on file  Food Insecurity: Not on file  Transportation Needs: Not on file  Physical Activity: Not on file  Stress: Not on file  Social Connections: Not on file  Intimate Partner Violence: Not on file    Family History  Problem Relation Age of Onset  . Hypertension Mother   . Heart attack Mother   . Heart disease Father        CABG, uncles  as well  . Parkinson's disease Father        dementia potentially from parkinsons  . Hypertension Father   . Colon cancer Father 40  . Hemochromatosis Brother     ROS: no fevers or chills, productive cough, hemoptysis, dysphasia, odynophagia, melena, hematochezia, dysuria, hematuria, rash, seizure activity, orthopnea, PND, pedal edema, claudication. Remaining systems are negative.  Physical Exam: Well-developed well-nourished in no acute distress.  Skin is warm and dry.  HEENT is normal.  Neck is supple.  Chest is clear to auscultation with normal expansion.  Cardiovascular exam is regular rate and rhythm.  Abdominal exam nontender or distended. No masses palpated. Extremities show no edema. neuro grossly intact  ECG-sinus rhythm with first-degree AV block and right bundle branch block.  Personally reviewed  A/P  1 CAD s/p CABG-pt denies CP; continue ASA and statin.  2 hyperlipidemia-continue statin.  Absolutely  3 ? AAA-not evident on most recent ultrasound.  We will likely repeat study November 2024.  Kirk Ruths, MD

## 2020-03-21 DIAGNOSIS — E7800 Pure hypercholesterolemia, unspecified: Secondary | ICD-10-CM | POA: Diagnosis not present

## 2020-03-22 LAB — LIPID PANEL
Chol/HDL Ratio: 2.8 ratio (ref 0.0–5.0)
Cholesterol, Total: 114 mg/dL (ref 100–199)
HDL: 41 mg/dL (ref >39)
LDL Chol Calc (NIH): 57 mg/dL (ref 0–99)
Triglycerides: 77 mg/dL (ref 0–149)
VLDL Cholesterol Cal: 16 mg/dL (ref 5–40)

## 2020-03-22 LAB — HEPATIC FUNCTION PANEL
ALT: 20 IU/L (ref 0–44)
AST: 25 IU/L (ref 0–40)
Albumin: 4.5 g/dL (ref 3.8–4.8)
Alkaline Phosphatase: 83 IU/L (ref 44–121)
Bilirubin Total: 0.5 mg/dL (ref 0.0–1.2)
Bilirubin, Direct: 0.17 mg/dL (ref 0.00–0.40)
Total Protein: 7.2 g/dL (ref 6.0–8.5)

## 2020-03-22 LAB — SPECIMEN STATUS REPORT

## 2020-03-23 ENCOUNTER — Encounter: Payer: Self-pay | Admitting: Cardiology

## 2020-03-23 ENCOUNTER — Other Ambulatory Visit: Payer: Self-pay

## 2020-03-23 ENCOUNTER — Ambulatory Visit (INDEPENDENT_AMBULATORY_CARE_PROVIDER_SITE_OTHER): Payer: Medicare Other | Admitting: Cardiology

## 2020-03-23 VITALS — BP 118/82 | HR 72 | Ht 70.0 in | Wt 172.0 lb

## 2020-03-23 DIAGNOSIS — I714 Abdominal aortic aneurysm, without rupture, unspecified: Secondary | ICD-10-CM

## 2020-03-23 DIAGNOSIS — I2510 Atherosclerotic heart disease of native coronary artery without angina pectoris: Secondary | ICD-10-CM

## 2020-03-23 DIAGNOSIS — E7800 Pure hypercholesterolemia, unspecified: Secondary | ICD-10-CM

## 2020-03-23 NOTE — Patient Instructions (Signed)

## 2020-06-28 NOTE — Progress Notes (Signed)
Phone [phone] In person visit   Subjective:   [name]. is a 70 y.o. year old very pleasant male patient who presents for/with See problem oriented charting Chief Complaint  Patient presents with  . Wound Check    Wound on right knee. Patient injured hisself 5 weeks ago.   This visit occurred during the SARS-CoV-2 public health emergency.  Safety protocols were in place, including screening questions prior to the visit, additional usage of staff PPE, and extensive cleaning of exam room while observing appropriate contact time as indicated for disinfecting solutions.   Past Medical History-  Patient Active Problem List   Diagnosis Date Noted  . 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 CAD, ARTERY BYPASS GRAFT 01/20/2008    Priority: High  . Hypertension 04/27/2013    Priority: Medium  . Hyperlipidemia 12/24/2006    Priority: Medium  . Basal cell carcinoma 12/11/2014    Priority: Low  . GERD 12/24/2006    Priority: Low  . History of adenomatous polyp of colon 04/11/2015    Medications- reviewed and updated Current Outpatient Medications  Medication Sig Dispense Refill  . aspirin EC 81 MG tablet Take 1 tablet (81 mg total) by mouth daily.    Marland Kitchen atorvastatin (LIPITOR) 80 MG tablet TAKE 1 TABLET BY MOUTH EVERY DAY 90 tablet 3  . calcium citrate-vitamin D (CITRACAL+D) 315-200 MG-UNIT per tablet 2 tablets. 2 tabs at bedtime    . Cholecalciferol (VITAMIN D3) 2000 UNITS TABS Take 1 tablet by mouth.     . Ferrous Sulfate Dried (IRON) 160 (50 FE) MG TBCR Take 1 tablet by mouth daily.    . magnesium oxide (MAG-OX) 400 MG tablet Take 400 mg by mouth 2 (two) times daily.    . Multiple Vitamin (MULTIVITAMIN) tablet Take 2 tablets by mouth 2 (two) times daily.    . Omega-3 Fatty Acids (OMEGA 3 PO) Take 2 capsules (3000 mg total) by mouth in the morning and take 1 capsule (1500 mg total) by mouth in the evening.    . sildenafil (VIAGRA) 50 MG tablet Take 1 tablet (50 mg  total) by mouth daily as needed for erectile dysfunction. 10 tablet 2  . vitamin B-12 (CYANOCOBALAMIN) 500 MCG tablet Take 500 mcg by mouth daily.     No current facility-administered medications for this visit.     Objective:  BP 136/82   Pulse 86   Temp 98.1 F (36.7 C) (Temporal)   Ht 5' 10"  (1.778 m)   Wt 173 lb (78.5 kg)   SpO2 95%   BMI 24.82 kg/m  Gen: NAD, resting comfortably CV: RRR no murmurs rubs or gallops Skin: warm, dry, 1 to 1-1/2 x 1 to 1-1/2 cm area on right knee that appears to be a ruptured blister-no surrounding significant erythema or warmth-no significant tenderness with palpation     Assessment and Plan  Wound on Knee S: Patient with injury 5 weeks ago to his right knee  April 17th was going to mow his yard but bag fell out and tripped him- fell onto concrete driveway- sprained thumb but improved quickly. Had a significant laceration/abrasion at least 2.5 cm in both directions. Used neosporin on it and seemed to be healing like traditional open wound- scab developed and was getting smaller overtime- scab disappeared. Last week it essentially froze in time and no longer healing- developed a bubble over wound- was slightly tender to touch. On Wednesday decided to get it evaluated- finally  started to get better this morning.   A/P: wound appears to be healing well though slowly/delayed. Went over signs of infection and reason for follow up.  I am hopeful this closes within the next month completely.  Doubt something like skin cancer with no skin abnormalities before fall  #Hypertension S: Remains diet and exercise controlled.  Home monitoring 118/71 most recently  Typically 118-122/74-76 -trying to exercise (exercise bike 30-42 mins 5 days a week and mixes in treadmill for 40-50 minutes or walk outside- he is working on adding some weights) and eat well. 5-6 hours of sleep but feels rested- sleeps hard BP Readings from Last 3 Encounters:  06/29/20 136/82   03/23/20 118/82  03/04/19 132/70  A/P: Stable. Continue current medications.     #Hyperlipidemia/CAD S: Compliant with atorvastatin 80 mg daily. Also takes aspirin 33m.  Lab Results  Component Value Date   CHOL 114 03/21/2020   HDL 41 03/21/2020   LDLCALC 57 03/21/2020   LDLDIRECT 45.0 10/27/2016   TRIG 77 03/21/2020   CHOLHDL 2.8 03/21/2020  A/P: CAD is asymptomatic.  Lipids with excellent control-has close follow-up with cardiology.  Continue current medication  #GERD S: Compliant with Protonix 40 mg every 3 days and weaned off of this over 2 years- by gradually reducing. Over last 2 months only 1 episode. Took an aCopywriter, advertisingchewable for that one episode.  Was having dysphagia nad had EGD back in 2013- did have some esophagitis- no barretts A/P: 70year-old patient was able to wean off of PPI.  Doing well with sparing otc alka seltzer chews. Reasonable to continue unless significant worsening- could try pepcid as first step if needed- would be safer than protonix or omeprazaole/prilosec   Recommended follow up: Return in about 1 year (around 06/29/2021) for follow up- or sooner if needed.  Lab/Order associations:   ICD-10-CM   1. Wound healing, delayed  T14.8XXD   2. Primary hypertension  IO70Basic metabolic panel    CBC with Differential/Platelet  3. Pure hypercholesterolemia  EJ62.83Basic metabolic panel    CBC with Differential/Platelet  4. Atherosclerosis of coronary artery bypass graft of native heart without angina pectoris  I25.810   5. Gastroesophageal reflux disease without esophagitis  K21.9   6. Screen for colon cancer  Z12.11 Ambulatory referral to Gastroenterology  7. Nocturia  R35.1 PSA    Return precautions advised.  SGarret Reddish MD

## 2020-06-28 NOTE — Patient Instructions (Addendum)
Health Maintenance Due  Topic Date Due  . COVID-19 Vaccine (4 - Booster for [employer] series)- let us know when you get this 02/21/2020  . COLONOSCOPY - We will call you within two weeks about your referral to GI for colonoscopy. If you do not hear within 2 weeks, give [place] a call.   04/05/2020   Could try pepcid/famotidine if your other OTC is not effective  Sign release of information at the check out desk for last urology visit  Schedule a lab visit at the check out desk within 2 weeks. Return for future fasting labs meaning nothing but water after midnight please. Ok to take your medications with water.   You are eligible to schedule your annual wellness visit with our nurse specialist Otila Kluver.  Please consider scheduling this before you leave today   Recommended follow up: Return in about 1 year (around 06/29/2021) for follow up- or sooner if needed.

## 2020-06-29 ENCOUNTER — Ambulatory Visit (INDEPENDENT_AMBULATORY_CARE_PROVIDER_SITE_OTHER): Payer: Medicare Other | Admitting: Family Medicine

## 2020-06-29 ENCOUNTER — Other Ambulatory Visit: Payer: Self-pay

## 2020-06-29 ENCOUNTER — Encounter: Payer: Self-pay | Admitting: Family Medicine

## 2020-06-29 VITALS — BP 136/82 | HR 86 | Temp 98.1°F | Ht 70.0 in | Wt 173.0 lb

## 2020-06-29 DIAGNOSIS — T148XXD Other injury of unspecified body region, subsequent encounter: Secondary | ICD-10-CM

## 2020-06-29 DIAGNOSIS — I10 Essential (primary) hypertension: Secondary | ICD-10-CM

## 2020-06-29 DIAGNOSIS — E7800 Pure hypercholesterolemia, unspecified: Secondary | ICD-10-CM

## 2020-06-29 DIAGNOSIS — I25810 Atherosclerosis of coronary artery bypass graft(s) without angina pectoris: Secondary | ICD-10-CM

## 2020-06-29 DIAGNOSIS — Z1211 Encounter for screening for malignant neoplasm of colon: Secondary | ICD-10-CM

## 2020-06-29 DIAGNOSIS — K219 Gastro-esophageal reflux disease without esophagitis: Secondary | ICD-10-CM

## 2020-06-29 DIAGNOSIS — S80911D Unspecified superficial injury of right knee, subsequent encounter: Secondary | ICD-10-CM

## 2020-06-29 DIAGNOSIS — R351 Nocturia: Secondary | ICD-10-CM | POA: Diagnosis not present

## 2020-07-03 ENCOUNTER — Other Ambulatory Visit: Payer: Self-pay

## 2020-07-03 ENCOUNTER — Other Ambulatory Visit (INDEPENDENT_AMBULATORY_CARE_PROVIDER_SITE_OTHER): Payer: Medicare Other

## 2020-07-03 DIAGNOSIS — I10 Essential (primary) hypertension: Secondary | ICD-10-CM

## 2020-07-03 DIAGNOSIS — E7800 Pure hypercholesterolemia, unspecified: Secondary | ICD-10-CM | POA: Diagnosis not present

## 2020-07-03 DIAGNOSIS — R351 Nocturia: Secondary | ICD-10-CM

## 2020-07-03 LAB — CBC WITH DIFFERENTIAL/PLATELET
Basophils Absolute: 0 10*3/uL (ref 0.0–0.1)
Basophils Relative: 0.6 % (ref 0.0–3.0)
Eosinophils Absolute: 0.2 10*3/uL (ref 0.0–0.7)
Eosinophils Relative: 3.3 % (ref 0.0–5.0)
HCT: 43.4 % (ref 39.0–52.0)
Hemoglobin: 14.8 g/dL (ref 13.0–17.0)
Lymphocytes Relative: 25.6 % (ref 12.0–46.0)
Lymphs Abs: 1.5 10*3/uL (ref 0.7–4.0)
MCHC: 34 g/dL (ref 30.0–36.0)
MCV: 90.8 fl (ref 78.0–100.0)
Monocytes Absolute: 0.4 10*3/uL (ref 0.1–1.0)
Monocytes Relative: 7.4 % (ref 3.0–12.0)
Neutro Abs: 3.8 10*3/uL (ref 1.4–7.7)
Neutrophils Relative %: 63.1 % (ref 43.0–77.0)
Platelets: 182 10*3/uL (ref 150.0–400.0)
RBC: 4.78 Mil/uL (ref 4.22–5.81)
RDW: 13.5 % (ref 11.5–15.5)
WBC: 6 10*3/uL (ref 4.0–10.5)

## 2020-07-03 LAB — BASIC METABOLIC PANEL
BUN: 25 mg/dL — ABNORMAL HIGH (ref 6–23)
CO2: 23 mEq/L (ref 19–32)
Calcium: 9 mg/dL (ref 8.4–10.5)
Chloride: 107 mEq/L (ref 96–112)
Creatinine, Ser: 0.99 mg/dL (ref 0.40–1.50)
GFR: 77.12 mL/min (ref >60.00)
Glucose, Bld: 96 mg/dL (ref 70–99)
Potassium: 4 mEq/L (ref 3.5–5.1)
Sodium: 139 mEq/L (ref 135–145)

## 2020-07-03 LAB — PSA: PSA: 4.67 ng/mL — ABNORMAL HIGH (ref 0.10–4.00)

## 2020-07-05 ENCOUNTER — Encounter: Payer: Self-pay | Admitting: Family Medicine

## 2020-07-17 DIAGNOSIS — Z23 Encounter for immunization: Secondary | ICD-10-CM | POA: Diagnosis not present

## 2020-07-26 ENCOUNTER — Telehealth: Payer: Self-pay

## 2020-07-26 NOTE — Telephone Encounter (Signed)
Called patient and explained that we have no open appointments within the next 24-48 hours. Advised patient to go to the [facility] location or at least an UC. Yvone Neu states he will see how he feels today and may go to the ED.   Nurse Assessment Nurse: Rolin Barry, RN, Levada Dy Date/Time Eilene Ghazi Time): 07/26/2020 9:13:33 AM Confirm and document reason for call. If symptomatic, describe symptoms. ---Caller states he fell Saturday on his chest. Caller was playing bad mitten and dived and landed on his chest. PT is now having hard time breathing or lifting heavy objects. Office doesn't have any appts until next week. Does the patient have any new or worsening symptoms? ---Yes Will a triage be completed? ---Yes Related visit to physician within the last 2 weeks? ---No Does the PT have any chronic conditions? (i.e. diabetes, asthma, this includes High risk factors for pregnancy, etc.) ---Yes List chronic conditions. ---CAD, quintuple by pass sx. Is this a behavioral health or substance abuse call? ---No Guidelines Guideline Title Affirmed Question Affirmed Notes Nurse Date/Time Eilene Ghazi Time) Chest Injury [1] Difficulty breathing AND [2] not severe Rolin Barry, RN, Levada Dy 07/26/2020 9:16:07 AM PLEASE NOTE: All timestamps contained within this report are represented as [place] Standard Time. CONFIDENTIALTY NOTICE: This fax transmission is intended only for the addressee. It contains information that is legally privileged, confidential or otherwise protected from use or disclosure. If you are not the intended recipient, you are strictly prohibited from reviewing, disclosing, copying using or disseminating any of this information or taking any action in reliance on or regarding this information. If you have received this fax in error, please notify us immediately by telephone so that we can arrange for its return to [place]. Phone: [phone], Toll-Free: [phone], Fax: [phone] Page: 2 of 2 Call Id:  [id number] [place]. Time Eilene Ghazi Time) Disposition Final User 07/26/2020 9:09:20 AM Send to Urgent Queue Kelby Aline 07/26/2020 9:20:44 AM Go to ED Now Yes Deaton, RN, Cindee Lame Disagree/Comply Disagree Caller Understands Yes PreDisposition Did not know what to do Care Advice Given Per Guideline GO TO ED NOW: * You need to be seen in the Emergency Department. * Go to the ED at ___________ [place] now. Drive carefully. CARE ADVICE given per Chest Injury (Adult) guideline. Comments User: Saverio Danker, RN Date/Time Eilene Ghazi Time): 07/26/2020 9:25:11 AM Caller advised that he would rather be seen in the office and have x rays there. Spoke with Danielle at the backline and she advised that she will let someone at the office know. Referrals GO TO FACILITY REFUSED

## 2020-07-26 NOTE — Telephone Encounter (Signed)
FYI , Please advise

## 2020-07-26 NOTE — Telephone Encounter (Signed)
Do any of the other [facility] offices have opening? If not I agree with urgent care at [facility] or ER

## 2020-09-12 DIAGNOSIS — Z85828 Personal history of other malignant neoplasm of skin: Secondary | ICD-10-CM | POA: Diagnosis not present

## 2020-09-12 DIAGNOSIS — D225 Melanocytic nevi of trunk: Secondary | ICD-10-CM | POA: Diagnosis not present

## 2020-09-12 DIAGNOSIS — D485 Neoplasm of uncertain behavior of skin: Secondary | ICD-10-CM | POA: Diagnosis not present

## 2020-09-12 DIAGNOSIS — L570 Actinic keratosis: Secondary | ICD-10-CM | POA: Diagnosis not present

## 2020-09-12 DIAGNOSIS — L814 Other melanin hyperpigmentation: Secondary | ICD-10-CM | POA: Diagnosis not present

## 2020-09-12 DIAGNOSIS — L84 Corns and callosities: Secondary | ICD-10-CM | POA: Diagnosis not present

## 2020-09-12 DIAGNOSIS — L858 Other specified epidermal thickening: Secondary | ICD-10-CM | POA: Diagnosis not present

## 2020-09-24 ENCOUNTER — Ambulatory Visit (AMBULATORY_SURGERY_CENTER): Payer: Medicare Other

## 2020-09-24 ENCOUNTER — Other Ambulatory Visit: Payer: Self-pay

## 2020-09-24 VITALS — Ht 70.0 in | Wt 164.0 lb

## 2020-09-24 DIAGNOSIS — Z1211 Encounter for screening for malignant neoplasm of colon: Secondary | ICD-10-CM

## 2020-09-24 DIAGNOSIS — Z86010 Personal history of colonic polyps: Secondary | ICD-10-CM

## 2020-09-24 MED ORDER — SUTAB 1479-225-188 MG PO TABS: 12.0000 | ORAL_TABLET | ORAL | 0 refills | Status: DC

## 2020-09-24 NOTE — Progress Notes (Signed)
Patient's pre-visit was done today over the phone with the patient   Name,DOB and address verified.  Patient denies any allergies to Eggs and Soy. Patient denies any problems with anesthesia/sedation. Patient denies taking diet pills or blood thinners. No home Oxygen. Packet of Prep instructions mailed to patient including a copy of a consent form-pt is aware. Patient understands to call us back with any questions or concerns. Patient is aware of our care-partner policy and [id number]-51 safety protocol.   EMMI education assigned to the patient for the procedure, sent to [facility].   The patient is COVID-19 vaccinated.    Pt instructed to stop iron 5 days before procedure.

## 2020-10-08 ENCOUNTER — Encounter: Payer: Medicare Other | Admitting: Internal Medicine

## 2020-10-10 ENCOUNTER — Other Ambulatory Visit: Payer: Self-pay

## 2020-10-10 ENCOUNTER — Encounter: Payer: Self-pay | Admitting: Internal Medicine

## 2020-10-10 ENCOUNTER — Ambulatory Visit (AMBULATORY_SURGERY_CENTER): Payer: Medicare Other | Admitting: Internal Medicine

## 2020-10-10 VITALS — BP 124/82 | HR 67 | Temp 97.5°F | Resp 16 | Ht 70.0 in | Wt 164.0 lb

## 2020-10-10 DIAGNOSIS — Z800 Family history of malignant neoplasm of digestive organs: Secondary | ICD-10-CM

## 2020-10-10 DIAGNOSIS — D122 Benign neoplasm of ascending colon: Secondary | ICD-10-CM

## 2020-10-10 DIAGNOSIS — I2510 Atherosclerotic heart disease of native coronary artery without angina pectoris: Secondary | ICD-10-CM | POA: Diagnosis not present

## 2020-10-10 DIAGNOSIS — Z86010 Personal history of colonic polyps: Secondary | ICD-10-CM | POA: Diagnosis not present

## 2020-10-10 MED ORDER — SODIUM CHLORIDE 0.9 % IV SOLN: 500.0000 mL | Freq: Once | INTRAVENOUS | Status: DC

## 2020-10-10 NOTE — Progress Notes (Signed)
Vernette Moise CRNA relieves [employer] after report

## 2020-10-10 NOTE — Progress Notes (Signed)
HISTORY OF PRESENT ILLNESS:  Rodney Docken. is a 70 y.o. male with a history of multiple adenomatous colon polyps and a family history of colon cancer he presents today for surveillance colonoscopy.  No active medical issues  REVIEW OF SYSTEMS:  All non-GI ROS negative. Past Medical History:  Diagnosis Date   CAD (coronary artery disease)    Celiac sprue    GERD (gastroesophageal reflux disease)    Hyperlipidemia    Low back pain    in 1980s back would "go out" about once a year. got better after brace in 1985.   OTITIS EXTERNA, ACUTE, LEFT     Past Surgical History:  Procedure Laterality Date   APPENDECTOMY  02/11/1971   COLONOSCOPY     CORONARY ARTERY BYPASS GRAFT  02/11/2007   5 arteries   NASAL SEPTOPLASTY W/ TURBINOPLASTY  02/11/1983   deviated septum   SHOULDER SURGERY Left 02/11/1960   TONSILECTOMY, ADENOIDECTOMY, BILATERAL MYRINGOTOMY AND TUBES  02/11/1955   UPPER GASTROINTESTINAL ENDOSCOPY      Social History [name].  reports that he has never smoked. He has never used smokeless tobacco. He reports current alcohol use of about 1.0 standard drink per week. He reports that he does not use drugs.  family history includes Colon cancer (age of onset: 60) in his father; Colon polyps in his sister; Heart attack in his mother; Heart disease in his father; Hemochromatosis in his brother; Hypertension in his father and mother; Parkinson's disease in his father.  No Known Allergies     PHYSICAL EXAMINATION:  Vital signs: BP 134/82   Pulse 70   Temp (!) 97.5 F (36.4 C)   Resp 13   Ht 5' 10"  (1.778 m)   Wt 164 lb (74.4 kg)   SpO2 98%   BMI 23.53 kg/m  General: Well-developed, well-nourished, no acute distress HEENT: Sclerae are anicteric, conjunctiva pink. Oral mucosa intact Lungs: Clear Heart: Regular Abdomen: soft, nontender, nondistended, no obvious ascites, no peritoneal signs, normal bowel sounds. No organomegaly. Extremities: No  edema Psychiatric: alert and oriented x3. Cooperative     ASSESSMENT:  1.  Personal history of multiple adenomatous colon polyps and family history of colon cancer now for surveillance colonoscopy   PLAN:  .  Surveillance colonoscopy

## 2020-10-10 NOTE — Progress Notes (Signed)
Called to room to assist during endoscopic procedure.  Patient ID and intended procedure confirmed with present staff. Received instructions for my participation in the procedure from the performing physician.  

## 2020-10-10 NOTE — Patient Instructions (Signed)
2 polyps removed today- await pathology.  Next colonoscopy in 5 years  Please read over handout about polyps  Please continue your normal medications   YOU HAD AN ENDOSCOPIC PROCEDURE TODAY AT [facility]:   Refer to the procedure report that was given to you for any specific questions about what was found during the examination.  If the procedure report does not answer your questions, please call your gastroenterologist to clarify.  If you requested that your care partner not be given the details of your procedure findings, then the procedure report has been included in a sealed envelope for you to review at your convenience later.  YOU SHOULD EXPECT: Some feelings of bloating in the abdomen. Passage of more gas than usual.  Walking can help get rid of the air that was put into your GI tract during the procedure and reduce the bloating. If you had a lower endoscopy (such as a colonoscopy or flexible sigmoidoscopy) you may notice spotting of blood in your stool or on the toilet paper. If you underwent a bowel prep for your procedure, you may not have a normal bowel movement for a few days.  Please Note:  You might notice some irritation and congestion in your nose or some drainage.  This is from the oxygen used during your procedure.  There is no need for concern and it should clear up in a day or so.  SYMPTOMS TO REPORT IMMEDIATELY:  Following lower endoscopy (colonoscopy or flexible sigmoidoscopy):  Excessive amounts of blood in the stool  Significant tenderness or worsening of abdominal pains  Swelling of the abdomen that is new, acute  Fever of 100F or higher For urgent or emergent issues, a gastroenterologist can be reached at any hour by calling [phone]. Do not use MyChart messaging for urgent concerns.    DIET:  We do recommend a small meal at first, but then you may proceed to your regular diet.  Drink plenty of fluids but you should avoid alcoholic beverages  for 24 hours.  ACTIVITY:  You should plan to take it easy for the rest of today and you should NOT DRIVE or use heavy machinery until tomorrow (because of the sedation medicines used during the test).    FOLLOW UP: Our staff will call the number listed on your records 48-72 hours following your procedure to check on you and address any questions or concerns that you may have regarding the information given to you following your procedure. If we do not reach you, we will leave a message.  We will attempt to reach you two times.  During this call, we will ask if you have developed any symptoms of COVID 19. If you develop any symptoms (ie: fever, flu-like symptoms, shortness of breath, cough etc.) before then, please call [phone].  If you test positive for Covid 19 in the 2 weeks post procedure, please call and report this information to [place].    If any biopsies were taken you will be contacted by phone or by letter within the next 1-3 weeks.  Please call us at [phone] if you have not heard about the biopsies in 3 weeks.    SIGNATURES/CONFIDENTIALITY: You and/or your care partner have signed paperwork which will be entered into your electronic medical record.  These signatures attest to the fact that that the information above on your After Visit Summary has been reviewed and is understood.  Full responsibility of the confidentiality of this discharge information  lies with you and/or your care-partner.

## 2020-10-10 NOTE — Progress Notes (Signed)
VS completed by Ledbetter.  Pt's states no medical or surgical changes since previsit or office visit.

## 2020-10-10 NOTE — Op Note (Signed)
[name] Patient Name: [name] Procedure Date: 10/10/2020 3:28 PM MRN: [id number] Endoscopist: Docia Chuck. [name] , MD Age: 70 Referring MD:  Date of Birth: June 01, 1950 Gender: Male Account #: [id number] Procedure:                Colonoscopy with cold snare polypectomy x 2 Indications:              High risk colon cancer surveillance: Personal                            history of multiple (3 or more) adenomas. Previous                            examinations 2006, 2011, 2017. Also father with                            colon cancer around age 41 Medicines:                Monitored Anesthesia Care Procedure:                Pre-Anesthesia Assessment:                           - Prior to the procedure, a History and Physical                            was performed, and patient medications and                            allergies were reviewed. The patient's tolerance of                            previous anesthesia was also reviewed. The risks                            and benefits of the procedure and the sedation                            options and risks were discussed with the patient.                            All questions were answered, and informed consent                            was obtained. Prior Anticoagulants: The patient has                            taken no previous anticoagulant or antiplatelet                            agents. After reviewing the risks and benefits, the                            patient was deemed in satisfactory condition to  undergo the procedure.                           After obtaining informed consent, the colonoscope                            was passed under direct vision. Throughout the                            procedure, the patient's blood pressure, pulse, and                            oxygen saturations were monitored continuously. The                            PCF-HQ190L Colonoscope  was introduced through the                            anus and advanced to the the cecum, identified by                            appendiceal orifice and ileocecal valve. The                            ileocecal valve, appendiceal orifice, and rectum                            were photographed. The quality of the bowel                            preparation was excellent. The colonoscopy was                            performed without difficulty. The patient tolerated                            the procedure well. The bowel preparation used was                            SUPREP/tablets via split dose instruction. Scope In: 3:47:53 PM Scope Out: 4:05:46 PM Scope Withdrawal Time: 0 hours 14 minutes 45 seconds  Total Procedure Duration: 0 hours 17 minutes 53 seconds  Findings:                 Two polyps were found in the ascending colon. The                            polyps were 1 to 2 mm in size. These polyps were                            removed with a cold snare. Resection and retrieval                            were complete.  The exam was otherwise without abnormality on                            direct and retroflexion views. There was angulation                            of the rectosigmoid region best navigated with the                            pediatric scope. Complications:            No immediate complications. Estimated blood loss:                            None. Estimated Blood Loss:     Estimated blood loss: none. Recommendation:           - Repeat colonoscopy in 5 years for surveillance.                            PEDIATRIC SCOPE                           - Patient has a contact number available for                            emergencies. The signs and symptoms of potential                            delayed complications were discussed with the                            patient. Return to normal activities tomorrow.                             Written discharge instructions were provided to the                            patient.                           - Resume previous diet.                           - Continue present medications.                           - Await pathology results. Docia Chuck. Rodney Pastor, MD 10/10/2020 4:11:27 PM This report has been signed electronically.

## 2020-10-10 NOTE — Progress Notes (Signed)
Sedate, gd SR's, VSS, report to RN

## 2020-10-12 ENCOUNTER — Telehealth: Payer: Self-pay | Admitting: *Deleted

## 2020-10-12 NOTE — Telephone Encounter (Signed)
  Follow up Call-  Call back number 10/10/2020  Post procedure Call Back phone  # [phone]  Permission to leave phone message Yes  Some recent data might be hidden     Patient questions:  Do you have a fever, pain , or abdominal swelling? No. Pain Score  0 *  Have you tolerated food without any problems? Yes.    Have you been able to return to your normal activities? Yes.    Do you have any questions about your discharge instructions: Diet   No. Medications  No. Follow up visit  No.  Do you have questions or concerns about your Care? No.  Actions: * If pain score is 4 or above: No action needed, pain <4.

## 2020-10-16 ENCOUNTER — Encounter: Payer: Self-pay | Admitting: Internal Medicine

## 2020-11-27 DIAGNOSIS — Z23 Encounter for immunization: Secondary | ICD-10-CM | POA: Diagnosis not present

## 2020-12-11 ENCOUNTER — Ambulatory Visit (INDEPENDENT_AMBULATORY_CARE_PROVIDER_SITE_OTHER): Payer: Medicare Other

## 2020-12-11 ENCOUNTER — Other Ambulatory Visit: Payer: Self-pay

## 2020-12-11 DIAGNOSIS — Z23 Encounter for immunization: Secondary | ICD-10-CM | POA: Diagnosis not present

## 2021-01-17 ENCOUNTER — Other Ambulatory Visit: Payer: Self-pay | Admitting: Cardiology

## 2021-03-05 DIAGNOSIS — H25013 Cortical age-related cataract, bilateral: Secondary | ICD-10-CM | POA: Diagnosis not present

## 2021-03-05 DIAGNOSIS — D3132 Benign neoplasm of left choroid: Secondary | ICD-10-CM | POA: Diagnosis not present

## 2021-03-05 DIAGNOSIS — H524 Presbyopia: Secondary | ICD-10-CM | POA: Diagnosis not present

## 2021-03-05 DIAGNOSIS — H18503 Unspecified hereditary corneal dystrophies, bilateral: Secondary | ICD-10-CM | POA: Diagnosis not present

## 2021-03-05 DIAGNOSIS — H5201 Hypermetropia, right eye: Secondary | ICD-10-CM | POA: Diagnosis not present

## 2021-03-05 DIAGNOSIS — H52223 Regular astigmatism, bilateral: Secondary | ICD-10-CM | POA: Diagnosis not present

## 2021-04-18 ENCOUNTER — Other Ambulatory Visit: Payer: Self-pay | Admitting: Cardiology

## 2021-05-09 ENCOUNTER — Other Ambulatory Visit: Payer: Self-pay | Admitting: Cardiology

## 2021-05-23 ENCOUNTER — Other Ambulatory Visit: Payer: Self-pay | Admitting: Cardiology

## 2021-06-15 ENCOUNTER — Other Ambulatory Visit: Payer: Self-pay | Admitting: Cardiology

## 2021-06-26 ENCOUNTER — Other Ambulatory Visit: Payer: Self-pay | Admitting: Cardiology

## 2021-07-05 ENCOUNTER — Encounter: Payer: Self-pay | Admitting: Family Medicine

## 2021-07-05 ENCOUNTER — Ambulatory Visit (INDEPENDENT_AMBULATORY_CARE_PROVIDER_SITE_OTHER): Payer: Medicare Other | Admitting: Family Medicine

## 2021-07-05 VITALS — BP 132/82 | HR 65 | Temp 98.2°F | Ht 70.0 in | Wt 172.2 lb

## 2021-07-05 DIAGNOSIS — I10 Essential (primary) hypertension: Secondary | ICD-10-CM | POA: Diagnosis not present

## 2021-07-05 DIAGNOSIS — R972 Elevated prostate specific antigen [PSA]: Secondary | ICD-10-CM

## 2021-07-05 DIAGNOSIS — I25810 Atherosclerosis of coronary artery bypass graft(s) without angina pectoris: Secondary | ICD-10-CM | POA: Diagnosis not present

## 2021-07-05 DIAGNOSIS — E7800 Pure hypercholesterolemia, unspecified: Secondary | ICD-10-CM

## 2021-07-05 LAB — CBC WITH DIFFERENTIAL/PLATELET
Basophils Absolute: 0.1 10*3/uL (ref 0.0–0.1)
Basophils Relative: 1 % (ref 0.0–3.0)
Eosinophils Absolute: 0.3 10*3/uL (ref 0.0–0.7)
Eosinophils Relative: 3.6 % (ref 0.0–5.0)
HCT: 45.4 % (ref 39.0–52.0)
Hemoglobin: 15.1 g/dL (ref 13.0–17.0)
Lymphocytes Relative: 28.5 % (ref 12.0–46.0)
Lymphs Abs: 2.1 10*3/uL (ref 0.7–4.0)
MCHC: 33.4 g/dL (ref 30.0–36.0)
MCV: 92 fl (ref 78.0–100.0)
Monocytes Absolute: 0.6 10*3/uL (ref 0.1–1.0)
Monocytes Relative: 7.9 % (ref 3.0–12.0)
Neutro Abs: 4.4 10*3/uL (ref 1.4–7.7)
Neutrophils Relative %: 59 % (ref 43.0–77.0)
Platelets: 185 10*3/uL (ref 150.0–400.0)
RBC: 4.94 Mil/uL (ref 4.22–5.81)
RDW: 13.5 % (ref 11.5–15.5)
WBC: 7.5 10*3/uL (ref 4.0–10.5)

## 2021-07-05 LAB — LIPID PANEL
Cholesterol: 102 mg/dL (ref 0–200)
HDL: 41.9 mg/dL (ref >39.00)
LDL Cholesterol: 43 mg/dL (ref 0–99)
NonHDL: 59.69
Total CHOL/HDL Ratio: 2
Triglycerides: 83 mg/dL (ref 0.0–149.0)
VLDL: 16.6 mg/dL (ref 0.0–40.0)

## 2021-07-05 LAB — COMPREHENSIVE METABOLIC PANEL
ALT: 19 U/L (ref 0–53)
AST: 20 U/L (ref 0–37)
Albumin: 4.6 g/dL (ref 3.5–5.2)
Alkaline Phosphatase: 60 U/L (ref 39–117)
BUN: 25 mg/dL — ABNORMAL HIGH (ref 6–23)
CO2: 28 mEq/L (ref 19–32)
Calcium: 9.4 mg/dL (ref 8.4–10.5)
Chloride: 105 mEq/L (ref 96–112)
Creatinine, Ser: 1.04 mg/dL (ref 0.40–1.50)
GFR: 72.18 mL/min (ref >60.00)
Glucose, Bld: 93 mg/dL (ref 70–99)
Potassium: 4.5 mEq/L (ref 3.5–5.1)
Sodium: 140 mEq/L (ref 135–145)
Total Bilirubin: 0.9 mg/dL (ref 0.2–1.2)
Total Protein: 7.2 g/dL (ref 6.0–8.3)

## 2021-07-05 LAB — PSA: PSA: 5.49 ng/mL — ABNORMAL HIGH (ref 0.10–4.00)

## 2021-07-05 MED ORDER — ATORVASTATIN CALCIUM 80 MG PO TABS: 80.0000 mg | ORAL_TABLET | Freq: Every day | ORAL | 3 refills | Status: DC

## 2021-07-05 NOTE — Progress Notes (Signed)
Phone [phone] In person visit   Subjective:   [name]. is a 71 y.o. year old very pleasant male patient who presents for/with See problem oriented charting Chief Complaint  Patient presents with   Follow-up   Hypertension   Hyperlipidemia   Past Medical History-  Patient Active Problem List   Diagnosis Date Noted   Celiac sprue 05/31/2010    Priority: High   CAD, ARTERY BYPASS GRAFT 01/20/2008    Priority: High   Hypertension 04/27/2013    Priority: Medium    Hyperlipidemia 12/24/2006    Priority: Medium    Basal cell carcinoma 12/11/2014    Priority: Low   GERD 12/24/2006    Priority: Low   History of adenomatous polyp of colon 04/11/2015    Medications- reviewed and updated Current Outpatient Medications  Medication Sig Dispense Refill   aspirin EC 81 MG tablet Take 1 tablet (81 mg total) by mouth daily.     calcium citrate-vitamin D (CITRACAL+D) 315-200 MG-UNIT per tablet 2 tablets. 2 tabs at bedtime     Cholecalciferol (VITAMIN D3) 2000 UNITS TABS Take 1 tablet by mouth.      Ferrous Sulfate Dried (IRON) 160 (50 FE) MG TBCR Take 1 tablet by mouth daily.     magnesium oxide (MAG-OX) 400 MG tablet Take 400 mg by mouth 2 (two) times daily.     Multiple Vitamin (MULTIVITAMIN) tablet Take 2 tablets by mouth 2 (two) times daily.     Omega-3 Fatty Acids (OMEGA 3 PO) Take 2 capsules (3000 mg total) by mouth in the morning and take 1 capsule (1500 mg total) by mouth in the evening.     vitamin B-12 (CYANOCOBALAMIN) 500 MCG tablet Take 500 mcg by mouth daily.     atorvastatin (LIPITOR) 80 MG tablet Take 1 tablet (80 mg total) by mouth daily. 90 tablet 3   No current facility-administered medications for this visit.     Objective:  BP 132/82   Pulse 65   Temp 98.2 F (36.8 C)   Ht 5' 10"  (1.778 m)   Wt 172 lb 3.2 oz (78.1 kg)   SpO2 98%   BMI 24.71 kg/m  Gen: NAD, resting comfortably CV: RRR no murmurs rubs or gallops Lungs: CTAB no crackles,  wheeze, rhonchi Abdomen: soft/nontender/nondistended/normal bowel sounds.  Ext: no edema Skin: warm, dry Neuro: grossly normal, moves all extremities     Assessment and Plan   #Hypertension S: Remains diet and exercise controlled.   Home monitoring: 120/76 most common reading and most recent as well  -getting bike or treadmill at least 5 days a week on average but not much strength training BP Readings from Last 3 Encounters:  07/05/21 132/82  10/10/20 124/82  06/29/20 136/82  A/P: Controlled. Continue current medications.  -readings even better at home    #Hyperlipidemia/CAD- last visit Dr. Stanford Breed 03/23/20 S: Compliant with atorvastatin 80 mg daily. Also takes aspirin 37m.  No chest pain or shortness of breath even with exercise Lab Results  Component Value Date   CHOL 114 03/21/2020   HDL 41 03/21/2020   LDLCALC 57 03/21/2020   LDLDIRECT 45.0 10/27/2016   TRIG 77 03/21/2020   CHOLHDL 2.8 03/21/2020  A/P: Coronary artery disease is asymptomatic-continue current medications Lipids have been very well controlled and we will update with labs today-continue atorvastatin 80 mg -encouraged him to follow up with Dr. CStanford Breed #GERD S: Compliant with Protonix 40 mg every 3 days last year- was able  to stop and doing well   A/P: doing well without meds!    #History of colon polyps-every 5 year repeat with last colonoscopy 10/10/2020  #skin cancer screening- following with [facility] [facility]  #AAA read previously- last scan with cardiology was normal- he is considering repeat 2024  #elevated psa - last seen 2019- last year encouraged follow up but PSA largely stable compared to last few years- had recommended follow up with urology due to slight increase- we will update today and go ahead and refer him back. Did cycline 15 mins last night Lab Results  Component Value Date   PSA 4.67 (H) 07/03/2020   PSA 4.20 (H) 11/14/2016   PSA 4.2 11/14/2016   Recommended follow up: Return in  about 1 year (around 07/06/2022) for followup or sooner if needed.Schedule b4 you leave. No future appointments.  Lab/Order associations:   ICD-10-CM   1. Primary hypertension  I10 CBC with Differential/Platelet    Comprehensive metabolic panel    Lipid panel    2. Pure hypercholesterolemia  E78.00 CBC with Differential/Platelet    Comprehensive metabolic panel    Lipid panel    3. Atherosclerosis of coronary artery bypass graft of native heart without angina pectoris  I25.810     4. Elevated PSA  R97.20 PSA    Ambulatory referral to Urology     Meds ordered this encounter  Medications   atorvastatin (LIPITOR) 80 MG tablet    Sig: Take 1 tablet (80 mg total) by mouth daily.    Dispense:  90 tablet    Refill:  3    Return precautions advised.  Garret Reddish, MD

## 2021-07-05 NOTE — Patient Instructions (Addendum)
We will call you within two weeks about your referral to urology (can take a few months to get in). If you do not hear within 2 weeks, give [place] a call.    Please stop by lab before you go If you have mychart- we will send your results within 3 business days of [place] receiving them.  If you do not have mychart- we will call you about results within 5 business days of [place] receiving them.  *please also note that you will see labs on mychart as soon as they post. I will later go in and write notes on them- will say "notes from Dr. Yong Channel"   I like your idea of adding strength training  Recommended follow up: Return in about 1 year (around 07/06/2022) for followup or sooner if needed.Schedule b4 you leave. Unless BP went up- goal <135/85 on average

## 2021-09-09 NOTE — Progress Notes (Signed)
HPI:FU coronary artery disease status post coronary bypassing graft in January 2009. Note at that time, he had a LIMA to the LAD, free RIMA to the obtuse marginal, and sequential saphenous vein graft to the first and second diagonals, as well as saphenous vein graft to the PDA. Note preoperative carotid Dopplers performed revealed no significant right or left internal carotid artery stenosis. Nuclear study 5/14 showed EF 63 with normal perfusion. Abd ultrasound 11/21 showed mildly dilated right common iliac; FU study as needed. Since I last saw him, the patient denies any dyspnea on exertion, orthopnea, PND, pedal edema, palpitations, syncope or chest pain.   Current Outpatient Medications  Medication Sig Dispense Refill   aspirin EC 81 MG tablet Take 1 tablet (81 mg total) by mouth daily.     atorvastatin (LIPITOR) 80 MG tablet Take 1 tablet (80 mg total) by mouth daily. 90 tablet 3   calcium citrate-vitamin D (CITRACAL+D) 315-200 MG-UNIT per tablet 2 tablets. 2 tabs at bedtime     Cholecalciferol (VITAMIN D3) 2000 UNITS TABS Take 1 tablet by mouth.      Ferrous Sulfate Dried (IRON) 160 (50 FE) MG TBCR Take 1 tablet by mouth daily.     magnesium oxide (MAG-OX) 400 MG tablet Take 400 mg by mouth 2 (two) times daily.     Multiple Vitamin (MULTIVITAMIN) tablet Take 2 tablets by mouth 2 (two) times daily.     Omega-3 Fatty Acids (OMEGA 3 PO) Take 2 capsules (3000 mg total) by mouth in the morning and take 1 capsule (1500 mg total) by mouth in the evening.     vitamin B-12 (CYANOCOBALAMIN) 500 MCG tablet Take 500 mcg by mouth daily.     No current facility-administered medications for this visit.     Past Medical History:  Diagnosis Date   CAD (coronary artery disease)    Celiac sprue    GERD (gastroesophageal reflux disease)    Hyperlipidemia    Low back pain    in 1980s back would "go out" about once a year. got better after brace in 1985.   OTITIS EXTERNA, ACUTE, LEFT     Past  Surgical History:  Procedure Laterality Date   APPENDECTOMY  02/11/1971   COLONOSCOPY     CORONARY ARTERY BYPASS GRAFT  02/11/2007   5 arteries   NASAL SEPTOPLASTY W/ TURBINOPLASTY  02/11/1983   deviated septum   SHOULDER SURGERY Left 02/11/1960   TONSILECTOMY, ADENOIDECTOMY, BILATERAL MYRINGOTOMY AND TUBES  02/11/1955   UPPER GASTROINTESTINAL ENDOSCOPY      Social History   Socioeconomic History   Marital status: Married    Spouse name: Not on file   Number of children: Not on file   Years of education: Not on file   Highest education level: Not on file  Occupational History   Not on file  Tobacco Use   Smoking status: Never   Smokeless tobacco: Never  Vaping Use   Vaping Use: Never used  Substance and Sexual Activity   Alcohol use: Yes    Alcohol/week: 1.0 standard drink of alcohol    Types: 1 Standard drinks or equivalent per week   Drug use: No   Sexual activity: Not on file  Other Topics Concern   Not on file  Social History Narrative   Married 44 years in 2016. No children. 8 dogs- chihuahua and mixes of chihuahua      [employer]. 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 Plans to work until death but may cut hours back or maybe stop around 31. Minimum 75.       Hobbies: time with wife and dogs   Exercise in evenings- walk or run (limits due to knees), exercise bike and treadmill- 5x a week      Social Determinants of Health   Financial Resource Strain: Not on file  Food Insecurity: Not on file  Transportation Needs: Not on file  Physical Activity: Not on file  Stress: Not on file  Social Connections: Not on file  Intimate Partner Violence: Not on file    Family History  Problem Relation Age of Onset   Hypertension Mother    Heart attack Mother    Heart disease Father        CABG, uncles as well   Parkinson's disease Father        dementia potentially from parkinsons   Hypertension Father     Colon cancer Father 44   Colon polyps Sister    Hemochromatosis Brother    Esophageal cancer Neg Hx    Stomach cancer Neg Hx    Rectal cancer Neg Hx     ROS: no fevers or chills, productive cough, hemoptysis, dysphasia, odynophagia, melena, hematochezia, dysuria, hematuria, rash, seizure activity, orthopnea, PND, pedal edema, claudication. Remaining systems are negative.  Physical Exam: Well-developed well-nourished in no acute distress.  Skin is warm and dry.  HEENT is normal.  Neck is supple.  Chest is clear to auscultation with normal expansion.  Cardiovascular exam is regular rate and rhythm.  Abdominal exam nontender or distended. No masses palpated. Extremities show no edema. neuro grossly intact  ECG-normal sinus rhythm at a rate of 96, right bundle branch block.  Personally reviewed  A/P  1 coronary artery disease status post coronary artery bypass and graft-patient continues to do well with no symptoms.  Continue medical therapy with aspirin and statin.  2 hyperlipidemia-continue statin.  3 question history of abdominal aortic aneurysm-we will plan to repeat ultrasound November 2024.  Note it was not evident on his most recent ultrasound.  Kirk Ruths, MD

## 2021-09-17 ENCOUNTER — Other Ambulatory Visit: Payer: Self-pay

## 2021-09-17 DIAGNOSIS — E7800 Pure hypercholesterolemia, unspecified: Secondary | ICD-10-CM | POA: Diagnosis not present

## 2021-09-17 LAB — HEPATIC FUNCTION PANEL
ALT: 18 IU/L (ref 0–44)
AST: 24 IU/L (ref 0–40)
Albumin: 4.9 g/dL — ABNORMAL HIGH (ref 3.8–4.8)
Alkaline Phosphatase: 69 IU/L (ref 44–121)
Bilirubin Total: 0.8 mg/dL (ref 0.0–1.2)
Bilirubin, Direct: 0.23 mg/dL (ref 0.00–0.40)
Total Protein: 7.3 g/dL (ref 6.0–8.5)

## 2021-09-17 LAB — LIPID PANEL
Chol/HDL Ratio: 2.2 ratio (ref 0.0–5.0)
Cholesterol, Total: 108 mg/dL (ref 100–199)
HDL: 49 mg/dL (ref >39)
LDL Chol Calc (NIH): 40 mg/dL (ref 0–99)
Triglycerides: 98 mg/dL (ref 0–149)
VLDL Cholesterol Cal: 19 mg/dL (ref 5–40)

## 2021-09-23 ENCOUNTER — Encounter: Payer: Self-pay | Admitting: Cardiology

## 2021-09-23 ENCOUNTER — Ambulatory Visit (INDEPENDENT_AMBULATORY_CARE_PROVIDER_SITE_OTHER): Payer: Medicare Other | Admitting: Cardiology

## 2021-09-23 VITALS — BP 132/80 | HR 96 | Ht 70.0 in | Wt 168.0 lb

## 2021-09-23 DIAGNOSIS — E7800 Pure hypercholesterolemia, unspecified: Secondary | ICD-10-CM | POA: Diagnosis not present

## 2021-09-23 DIAGNOSIS — I25810 Atherosclerosis of coronary artery bypass graft(s) without angina pectoris: Secondary | ICD-10-CM

## 2021-09-23 DIAGNOSIS — I2510 Atherosclerotic heart disease of native coronary artery without angina pectoris: Secondary | ICD-10-CM

## 2021-09-23 NOTE — Patient Instructions (Signed)
Medication Instructions:  Continue same medications *If you need a refill on your cardiac medications before your next appointment, please call your pharmacy*   Lab Work: None ordered   Testing/Procedures: None ordered   Follow-Up: At [facility], you and your health needs are our priority.  As part of our continuing mission to provide you with exceptional heart care, we have created designated Provider Care Teams.  These Care Teams include your primary Cardiologist (physician) and Advanced Practice Providers (APPs -  Physician Assistants and Nurse Practitioners) who all work together to provide you with the care you need, when you need it.  We recommend signing up for the patient portal called "MyChart".  Sign up information is provided on this After Visit Summary.  MyChart is used to connect with patients for Virtual Visits (Telemedicine).  Patients are able to view lab/test results, encounter notes, upcoming appointments, etc.  Non-urgent messages can be sent to your provider as well.   To learn more about what you can do with MyChart, go to [email or web address].      Your next appointment:  1 year     The format for your next appointment: Office   Provider:  Dr.Crenshaw    Important Information About Sugar

## 2021-11-04 ENCOUNTER — Encounter: Payer: Self-pay | Admitting: *Deleted

## 2021-11-06 DIAGNOSIS — Z23 Encounter for immunization: Secondary | ICD-10-CM | POA: Diagnosis not present

## 2021-12-18 ENCOUNTER — Ambulatory Visit (INDEPENDENT_AMBULATORY_CARE_PROVIDER_SITE_OTHER): Payer: Medicare Other

## 2021-12-18 DIAGNOSIS — Z23 Encounter for immunization: Secondary | ICD-10-CM

## 2022-03-18 ENCOUNTER — Telehealth: Payer: Self-pay | Admitting: Family Medicine

## 2022-03-18 NOTE — Telephone Encounter (Signed)
Copied from [place] [phone]. Topic: Medicare AWV >> Mar 18, 2022 12:25 PM Gillis Santa wrote: Reason for CRM: LVM PATIENT TO CALL [phone] TO SCHEDULE AWV [name]

## 2022-03-25 ENCOUNTER — Ambulatory Visit (INDEPENDENT_AMBULATORY_CARE_PROVIDER_SITE_OTHER): Payer: Medicare Other

## 2022-03-25 VITALS — BP 120/80 | HR 85 | Temp 97.3°F | Wt 170.8 lb

## 2022-03-25 DIAGNOSIS — Z0000 Encounter for general adult medical examination without abnormal findings: Secondary | ICD-10-CM

## 2022-03-25 NOTE — Patient Instructions (Signed)
[name] , Thank you for taking time to come for your Medicare Wellness Visit. I appreciate your ongoing commitment to your health goals. Please review the following plan we discussed and let me know if I can assist you in the future.   These are the goals we discussed:  Goals   None     This is a list of the screening recommended for you and due dates:  Health Maintenance  Topic Date Due   Medicare Annual Wellness Visit  03/03/2020   COVID-19 Vaccine (6 - 2023-24 season) 10/11/2021   Colon Cancer Screening  10/10/2025   DTaP/Tdap/Td vaccine (4 - Td or Tdap) 04/13/2028   Pneumonia Vaccine  Completed   Flu Shot  Completed   Hepatitis C Screening: USPSTF Recommendation to screen - Ages 18-79 yo.  Completed   Zoster (Shingles) Vaccine  Completed   HPV Vaccine  Aged Out    Advanced directives: Advance directive discussed with you today. Even though you declined this today please call our office should you change your mind and we can give you the proper paperwork for you to fill out.   Conditions/risks identified: get more strength training    Next appointment: Follow up in one year for your annual wellness visit.   Preventive Care 37 Years and Older, Male  Preventive care refers to lifestyle choices and visits with your health care provider that can promote health and wellness. What does preventive care include? A yearly physical exam. This is also called an annual well check. Dental exams once or twice a year. Routine eye exams. Ask your health care provider how often you should have your eyes checked. Personal lifestyle choices, including: Daily care of your teeth and gums. Regular physical activity. Eating a healthy diet. Avoiding tobacco and drug use. Limiting alcohol use. Practicing safe sex. Taking low doses of aspirin every day. Taking vitamin and mineral supplements as recommended by your health care provider. What happens during an annual well check? The services  and screenings done by your health care provider during your annual well check will depend on your age, overall health, lifestyle risk factors, and family history of disease. Counseling  Your health care provider may ask you questions about your: Alcohol use. Tobacco use. Drug use. Emotional well-being. Home and relationship well-being. Sexual activity. Eating habits. History of falls. Memory and ability to understand (cognition). Work and work Statistician. Screening  You may have the following tests or measurements: Height, weight, and BMI. Blood pressure. Lipid and cholesterol levels. These may be checked every 5 years, or more frequently if you are over 2 years old. Skin check. Lung cancer screening. You may have this screening every year starting at age 80 if you have a 30-pack-year history of smoking and currently smoke or have quit within the past 15 years. Fecal occult blood test (FOBT) of the stool. You may have this test every year starting at age 35. Flexible sigmoidoscopy or colonoscopy. You may have a sigmoidoscopy every 5 years or a colonoscopy every 10 years starting at age 76. Prostate cancer screening. Recommendations will vary depending on your family history and other risks. Hepatitis C blood test. Hepatitis B blood test. Sexually transmitted disease (STD) testing. Diabetes screening. This is done by checking your blood sugar (glucose) after you have not eaten for a while (fasting). You may have this done every 1-3 years. Abdominal aortic aneurysm (AAA) screening. You may need this if you are a current or former smoker. Osteoporosis. You  may be screened starting at age 67 if you are at high risk. Talk with your health care provider about your test results, treatment options, and if necessary, the need for more tests. Vaccines  Your health care provider may recommend certain vaccines, such as: Influenza vaccine. This is recommended every year. Tetanus, diphtheria,  and acellular pertussis (Tdap, Td) vaccine. You may need a Td booster every 10 years. Zoster vaccine. You may need this after age 23. Pneumococcal 13-valent conjugate (PCV13) vaccine. One dose is recommended after age 60. Pneumococcal polysaccharide (PPSV23) vaccine. One dose is recommended after age 36. Talk to your health care provider about which screenings and vaccines you need and how often you need them. This information is not intended to replace advice given to you by your health care provider. Make sure you discuss any questions you have with your health care provider. Document Released: 02/23/2015 Document Revised: 10/17/2015 Document Reviewed: 11/28/2014 Elsevier Interactive Patient Education  2017 [place] Prevention in the Home Falls can cause injuries. They can happen to people of all ages. There are many things you can do to make your home safe and to help prevent falls. What can I do on the outside of my home? Regularly fix the edges of walkways and driveways and fix any cracks. Remove anything that might make you trip as you walk through a door, such as a raised step or threshold. Trim any bushes or trees on the path to your home. Use bright outdoor lighting. Clear any walking paths of anything that might make someone trip, such as rocks or tools. Regularly check to see if handrails are loose or broken. Make sure that both sides of any steps have handrails. Any raised decks and porches should have guardrails on the edges. Have any leaves, snow, or ice cleared regularly. Use sand or salt on walking paths during winter. Clean up any spills in your garage right away. This includes oil or grease spills. What can I do in the bathroom? Use night lights. Install grab bars by the toilet and in the tub and shower. Do not use towel bars as grab bars. Use non-skid mats or decals in the tub or shower. If you need to sit down in the shower, use a plastic, non-slip  stool. Keep the floor dry. Clean up any water that spills on the floor as soon as it happens. Remove soap buildup in the tub or shower regularly. Attach bath mats securely with double-sided non-slip rug tape. Do not have throw rugs and other things on the floor that can make you trip. What can I do in the bedroom? Use night lights. Make sure that you have a light by your bed that is easy to reach. Do not use any sheets or blankets that are too big for your bed. They should not hang down onto the floor. Have a firm chair that has side arms. You can use this for support while you get dressed. Do not have throw rugs and other things on the floor that can make you trip. What can I do in the kitchen? Clean up any spills right away. Avoid walking on wet floors. Keep items that you use a lot in easy-to-reach places. If you need to reach something above you, use a strong step stool that has a grab bar. Keep electrical cords out of the way. Do not use floor polish or wax that makes floors slippery. If you must use wax, use non-skid floor wax. Do  not have throw rugs and other things on the floor that can make you trip. What can I do with my stairs? Do not leave any items on the stairs. Make sure that there are handrails on both sides of the stairs and use them. Fix handrails that are broken or loose. Make sure that handrails are as long as the stairways. Check any carpeting to make sure that it is firmly attached to the stairs. Fix any carpet that is loose or worn. Avoid having throw rugs at the top or bottom of the stairs. If you do have throw rugs, attach them to the floor with carpet tape. Make sure that you have a light switch at the top of the stairs and the bottom of the stairs. If you do not have them, ask someone to add them for you. What else can I do to help prevent falls? Wear shoes that: Do not have high heels. Have rubber bottoms. Are comfortable and fit you well. Are closed at the  toe. Do not wear sandals. If you use a stepladder: Make sure that it is fully opened. Do not climb a closed stepladder. Make sure that both sides of the stepladder are locked into place. Ask someone to hold it for you, if possible. Clearly mark and make sure that you can see: Any grab bars or handrails. First and last steps. Where the edge of each step is. Use tools that help you move around (mobility aids) if they are needed. These include: Canes. Walkers. Scooters. Crutches. Turn on the lights when you go into a dark area. Replace any light bulbs as soon as they burn out. Set up your furniture so you have a clear path. Avoid moving your furniture around. If any of your floors are uneven, fix them. If there are any pets around you, be aware of where they are. Review your medicines with your doctor. Some medicines can make you feel dizzy. This can increase your chance of falling. Ask your doctor what other things that you can do to help prevent falls. This information is not intended to replace advice given to you by your health care provider. Make sure you discuss any questions you have with your health care provider. Document Released: 11/23/2008 Document Revised: 07/05/2015 Document Reviewed: 03/03/2014 Elsevier Interactive Patient Education  2017 [employer].

## 2022-03-25 NOTE — Progress Notes (Signed)
Subjective:   [name]. is a 72 y.o. male who presents for Medicare Annual/Subsequent preventive examination.  Review of Systems     Cardiac Risk Factors include: advanced age (>60mn, >>44women);male gender;dyslipidemia     Objective:    Today's Vitals   03/25/22 1151  Temp: (!) 97.3 F (36.3 C)  Weight: 170 lb 12.8 oz (77.5 kg)   Body mass index is 24.51 kg/m.     03/25/2022   11:56 AM 03/04/2019    8:51 AM 03/23/2015    4:45 PM  Advanced Directives  Does Patient Have a Medical Advance Directive? No Yes Yes  Type of Advance Directive  Living will;Healthcare Power of Attorney Living will;Healthcare Power of Attorney  Does patient want to make changes to medical advance directive?  No - Patient declined   Copy of [place]?  No - copy requested   Would patient like information on creating a medical advance directive? No - Patient declined      Current Medications (verified) Outpatient Encounter Medications as of 03/25/2022  Medication Sig   aspirin EC 81 MG tablet Take 1 tablet (81 mg total) by mouth daily.   atorvastatin (LIPITOR) 80 MG tablet Take 1 tablet (80 mg total) by mouth daily.   calcium citrate-vitamin D (CITRACAL+D) 315-200 MG-UNIT per tablet 2 tablets. 2 tabs at bedtime   Cholecalciferol (VITAMIN D3) 2000 UNITS TABS Take 1 tablet by mouth.    Ferrous Sulfate Dried (IRON) 160 (50 FE) MG TBCR Take 1 tablet by mouth daily.   magnesium oxide (MAG-OX) 400 MG tablet Take 400 mg by mouth 2 (two) times daily.   Multiple Vitamin (MULTIVITAMIN) tablet Take 2 tablets by mouth 2 (two) times daily.   Omega-3 Fatty Acids (OMEGA 3 PO) Take 2 capsules (3000 mg total) by mouth in the morning and take 1 capsule (1500 mg total) by mouth in the evening.   vitamin B-12 (CYANOCOBALAMIN) 500 MCG tablet Take 500 mcg by mouth daily.   No facility-administered encounter medications on file as of 03/25/2022.    Allergies (verified) Patient has no  known allergies.   History: Past Medical History:  Diagnosis Date   CAD (coronary artery disease)    Celiac sprue    GERD (gastroesophageal reflux disease)    Hyperlipidemia    Low back pain    in 1980s back would "go out" about once a year. got better after brace in 1985.   OTITIS EXTERNA, ACUTE, LEFT    Past Surgical History:  Procedure Laterality Date   APPENDECTOMY  02/11/1971   COLONOSCOPY     CORONARY ARTERY BYPASS GRAFT  02/11/2007   5 arteries   NASAL SEPTOPLASTY W/ TURBINOPLASTY  02/11/1983   deviated septum   SHOULDER SURGERY Left 02/11/1960   TONSILECTOMY, ADENOIDECTOMY, BILATERAL MYRINGOTOMY AND TUBES  02/11/1955   UPPER GASTROINTESTINAL ENDOSCOPY     Family History  Problem Relation Age of Onset   Hypertension Mother    Heart attack Mother    Heart disease Father        CABG, uncles as well   Parkinson's disease Father        dementia potentially from parkinsons   Hypertension Father    Colon cancer Father 616  Colon polyps Sister    Hemochromatosis Brother    Esophageal cancer Neg Hx    Stomach cancer Neg Hx    Rectal cancer Neg Hx    Social History   Socioeconomic  History   Marital status: Married    Spouse name: Not on file   Number of children: Not on file   Years of education: Not on file   Highest education level: Not on file  Occupational History   Not on file  Tobacco Use   Smoking status: Never   Smokeless tobacco: Never  Vaping Use   Vaping Use: Never used  Substance and Sexual Activity   Alcohol use: Yes    Alcohol/week: 3.0 standard drinks of alcohol    Types: 2 Glasses of wine, 1 Standard drinks or equivalent per week   Drug use: No   Sexual activity: Not on file  Other Topics Concern   Not on file  Social History Narrative   Married 44 years in 2016. No children. 8 dogs- chihuahua and mixes of chihuahua      [employer]. 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 Plans to  work until death but may cut hours back or maybe stop around 14. Minimum 74.       Hobbies: time with wife and dogs   Exercise in evenings- walk or run (limits due to knees), exercise bike and treadmill- 5x a week      Social Determinants of Health   Financial Resource Strain: Low Risk  (03/25/2022)   Overall Financial Resource Strain (CARDIA)    Difficulty of Paying Living Expenses: Not hard at all  Food Insecurity: No Food Insecurity (03/25/2022)   Hunger Vital Sign    Worried About Running Out of Food in the Last Year: Never true    Ran Out of Food in the Last Year: Never true  Transportation Needs: No Transportation Needs (03/25/2022)   PRAPARE - Hydrologist (Medical): No    Lack of Transportation (Non-Medical): No  Physical Activity: Sufficiently Active (03/25/2022)   Exercise Vital Sign    Days of Exercise per Week: 4 days    Minutes of Exercise per Session: 40 min  Stress: No Stress Concern Present (03/25/2022)   [place]    Feeling of Stress : Only a little  Social Connections: Moderately Integrated (03/25/2022)   Social Connection and Isolation Panel [NHANES]    Frequency of Communication with Friends and Family: Once a week    Frequency of Social Gatherings with Friends and Family: More than three times a week    Attends Religious Services: Never    Marine scientist or Organizations: Yes    Attends Music therapist: 1 to 4 times per year    Marital Status: Married    Tobacco Counseling Counseling given: Not Answered   Clinical Intake:  Pre-visit preparation completed: Yes  Pain : No/denies pain     BMI - recorded: 24.51 Nutritional Status: BMI of 19-24  Normal Nutritional Risks: None Diabetes: No  How often do you need to have someone help you when you read instructions, pamphlets, or other written materials from your doctor or pharmacy?: 1 -  Never  Diabetic?no  Interpreter Needed?: No  Information entered by :: Charlott Rakes, LPN   Activities of Daily Living    03/25/2022   11:57 AM  In your present state of health, do you have any difficulty performing the following activities:  Hearing? 0  Vision? 0  Difficulty concentrating or making decisions? 0  Walking or climbing stairs? 0  Dressing or bathing? 0  Doing errands, shopping? 0  Preparing Food and eating ? N  Using the Toilet? N  In the past six months, have you accidently leaked urine? N  Do you have problems with loss of bowel control? N  Managing your Medications? N  Managing your Finances? N  Housekeeping or managing your Housekeeping? N    Patient Care Team: Marin Olp, MD as PCP - General (Family Medicine) Stanford Breed Denice Bors, MD as Consulting Physician (Cardiology) Danella Sensing, MD as Consulting Physician (Dermatology)  Indicate any recent Medical Services you may have received from other than [facility] providers in the past year (date may be approximate).     Assessment:   This is a routine wellness examination for [name].  Hearing/Vision screen Hearing Screening - Comments:: Pt denies any hearing issues  Vision Screening - Comments:: Pt follows up with Dr Marica Otter for annual eye exams   Dietary issues and exercise activities discussed: Current Exercise Habits: Home exercise routine, Type of exercise: treadmill;strength training/weights;stretching;Other - see comments, Time (Minutes): 40, Frequency (Times/Week): 4, Weekly Exercise (Minutes/Week): 160   Goals Addressed             This Visit's Progress    Patient Stated       More strength  training        Depression Screen    03/25/2022   11:55 AM 07/05/2021    8:56 AM 06/29/2020    2:16 PM 03/04/2019    8:54 AM 04/12/2018   10:01 AM 10/27/2016    9:35 AM 01/19/2015    3:19 PM  PHQ 2/9 Scores  PHQ - 2 Score 0 0 0 0 0 0 0  PHQ- 9 Score  0 0        Fall Risk    03/25/2022    11:57 AM 07/05/2021    8:56 AM 06/29/2020    2:16 PM 03/04/2019    8:53 AM 04/12/2018   10:09 AM  Fall Risk   Falls in the past year? 0 0 1 0 0  Number falls in past yr: 0 0 0 0 0  Injury with Fall? 0 0 1 0 0  Risk for fall due to : Impaired vision No Fall Risks     Follow up Falls prevention discussed Falls evaluation completed  Falls evaluation completed;Education provided;Falls prevention discussed     FALL RISK PREVENTION PERTAINING TO THE HOME:  Any stairs in or around the home? Yes  If so, are there any without handrails? No  Home free of loose throw rugs in walkways, pet beds, electrical cords, etc? Yes  Adequate lighting in your home to reduce risk of falls? Yes   ASSISTIVE DEVICES UTILIZED TO PREVENT FALLS:  Life alert? No  Use of a cane, walker or w/c? No  Grab bars in the bathroom? No  Shower chair or bench in shower? No  Elevated toilet seat or a handicapped toilet? No   TIMED UP AND GO:  Was the test performed? Yes .  Length of time to ambulate 10 feet: 10 sec.   Gait steady and fast without use of assistive device  Cognitive Function:        03/25/2022   11:58 AM 03/04/2019    8:53 AM  6CIT Screen  What Year? 0 points 0 points  What month? 0 points 0 points  What time? 0 points 0 points  Count back from 20 0 points 0 points  Months in reverse 0 points 0 points  Repeat phrase 0 points 0 points  Total Score 0 points 0 points    Immunizations Immunization History  Administered Date(s) Administered   Fluad Quad(high Dose 65+) 11/26/2018, 12/11/2020, 12/18/2021   Influenza, High Dose Seasonal PF 12/21/2015, 11/24/2016, 12/01/2017   Influenza,inj,Quad PF,6+ Mos 01/05/2013, 11/24/2013, 12/28/2014   Influenza-Unspecified 11/14/2019   PFIZER(Purple Top)SARS-COV-2 Vaccination 04/03/2019, 04/27/2019, 11/21/2019, 07/17/2020   Pfizer Covid-19 Vaccine Bivalent Booster 64yr & up 11/27/2020   Pneumococcal Conjugate-13 01/19/2015   Pneumococcal  Polysaccharide-23 04/06/2017   Td 06/14/1996, 02/19/2007   Tdap 04/14/2018   Zoster Recombinat (Shingrix) 06/20/2017, 11/28/2017   Zoster, Live 06/07/2012    TDAP status: Up to date  Flu Vaccine status: Up to date  Pneumococcal vaccine status: Up to date  Covid-19 vaccine status: Completed vaccines  Qualifies for Shingles Vaccine? Yes   Zostavax completed Yes   Shingrix Completed?: Yes  Screening Tests Health Maintenance  Topic Date Due   COVID-19 Vaccine (6 - 2023-24 season) 10/11/2021   Medicare Annual Wellness (AWV)  03/26/2023   COLONOSCOPY (Pts 45-462yrInsurance coverage will need to be confirmed)  10/10/2025   DTaP/Tdap/Td (4 - Td or Tdap) 04/13/2028   Pneumonia Vaccine 6598Years old  Completed   INFLUENZA VACCINE  Completed   Hepatitis C Screening  Completed   Zoster Vaccines- Shingrix  Completed   HPV VACCINES  Aged Out    Health Maintenance  Health Maintenance Due  Topic Date Due   COVID-19 Vaccine (6 - 2023-24 season) 10/11/2021    Colorectal cancer screening: Type of screening: Colonoscopy. Completed 10/10/20. Repeat every 5 years Additional Screening:  Hepatitis C Screening: Completed 10/27/16  Vision Screening: Recommended annual ophthalmology exams for early detection of glaucoma and other disorders of the eye. Is the patient up to date with their annual eye exam?  Yes  Who is the provider or what is the name of the office in which the patient attends annual eye exams? Dr SaMarica OtterIf pt is not established with a provider, would they like to be referred to a provider to establish care? No .   Dental Screening: Recommended annual dental exams for proper oral hygiene  Community Resource Referral / Chronic Care Management: CRR required this visit?  No   CCM required this visit?  No      Plan:     I have personally reviewed and noted the following in the patient's chart:   Medical and social history Use of alcohol, tobacco or illicit drugs   Current medications and supplements including opioid prescriptions. Patient is not currently taking opioid prescriptions. Functional ability and status Nutritional status Physical activity Advanced directives List of other physicians Hospitalizations, surgeries, and ER visits in previous 12 months Vitals Screenings to include cognitive, depression, and falls Referrals and appointments  In addition, I have reviewed and discussed with patient certain preventive protocols, quality metrics, and best practice recommendations. A written personalized care plan for preventive services as well as general preventive health recommendations were provided to patient.     TiWillette BraceLPN   2/624THL Nurse Notes:

## 2022-04-02 DIAGNOSIS — H524 Presbyopia: Secondary | ICD-10-CM | POA: Diagnosis not present

## 2022-04-02 DIAGNOSIS — H25013 Cortical age-related cataract, bilateral: Secondary | ICD-10-CM | POA: Diagnosis not present

## 2022-04-02 DIAGNOSIS — H52223 Regular astigmatism, bilateral: Secondary | ICD-10-CM | POA: Diagnosis not present

## 2022-04-02 DIAGNOSIS — H18503 Unspecified hereditary corneal dystrophies, bilateral: Secondary | ICD-10-CM | POA: Diagnosis not present

## 2022-04-02 DIAGNOSIS — H53143 Visual discomfort, bilateral: Secondary | ICD-10-CM | POA: Diagnosis not present

## 2022-04-02 DIAGNOSIS — D3132 Benign neoplasm of left choroid: Secondary | ICD-10-CM | POA: Diagnosis not present

## 2022-04-02 DIAGNOSIS — H5201 Hypermetropia, right eye: Secondary | ICD-10-CM | POA: Diagnosis not present

## 2022-07-11 ENCOUNTER — Other Ambulatory Visit: Payer: Self-pay | Admitting: Family Medicine

## 2022-07-23 ENCOUNTER — Telehealth: Payer: Self-pay | Admitting: Cardiology

## 2022-07-23 ENCOUNTER — Other Ambulatory Visit: Payer: Self-pay

## 2022-07-23 MED ORDER — ATORVASTATIN CALCIUM 80 MG PO TABS: 80.0000 mg | ORAL_TABLET | Freq: Every day | ORAL | 0 refills | Status: DC

## 2022-07-23 NOTE — Telephone Encounter (Signed)
*  STAT* If patient is at the pharmacy, call can be transferred to refill team.   1. Which medications need to be refilled? (please list name of each medication and dose if known)   atorvastatin (LIPITOR) 80 MG tablet   2. Which pharmacy/location (including street and city if local pharmacy) is medication to be sent to?  [id number] - [place], [place] - [address]   3. Do they need a 30 day or 90 day supply?  90 day supply   Patient stated he has 1 week of this medication left.  Patient has appointment scheduled on 9/5.

## 2022-07-23 NOTE — Telephone Encounter (Signed)
Pt's medication was sent to pt's pharmacy as requested. Confirmation received.  °

## 2022-07-30 ENCOUNTER — Encounter (HOSPITAL_BASED_OUTPATIENT_CLINIC_OR_DEPARTMENT_OTHER): Payer: Self-pay

## 2022-07-30 ENCOUNTER — Emergency Department (HOSPITAL_BASED_OUTPATIENT_CLINIC_OR_DEPARTMENT_OTHER): Payer: Medicare Other | Admitting: Radiology

## 2022-07-30 ENCOUNTER — Telehealth: Payer: Self-pay | Admitting: Family Medicine

## 2022-07-30 ENCOUNTER — Emergency Department (HOSPITAL_BASED_OUTPATIENT_CLINIC_OR_DEPARTMENT_OTHER)
Admission: EM | Admit: 2022-07-30 | Discharge: 2022-07-30 | Disposition: A | Discharge status: Home / Self Care | Payer: Medicare Other | Attending: Emergency Medicine | Admitting: Emergency Medicine

## 2022-07-30 ENCOUNTER — Other Ambulatory Visit (HOSPITAL_BASED_OUTPATIENT_CLINIC_OR_DEPARTMENT_OTHER): Payer: Self-pay

## 2022-07-30 ENCOUNTER — Other Ambulatory Visit: Payer: Self-pay

## 2022-07-30 DIAGNOSIS — I2510 Atherosclerotic heart disease of native coronary artery without angina pectoris: Secondary | ICD-10-CM | POA: Diagnosis not present

## 2022-07-30 DIAGNOSIS — Z79899 Other long term (current) drug therapy: Secondary | ICD-10-CM | POA: Insufficient documentation

## 2022-07-30 DIAGNOSIS — Z7982 Long term (current) use of aspirin: Secondary | ICD-10-CM | POA: Insufficient documentation

## 2022-07-30 DIAGNOSIS — Z951 Presence of aortocoronary bypass graft: Secondary | ICD-10-CM | POA: Insufficient documentation

## 2022-07-30 DIAGNOSIS — K449 Diaphragmatic hernia without obstruction or gangrene: Secondary | ICD-10-CM | POA: Diagnosis not present

## 2022-07-30 DIAGNOSIS — I10 Essential (primary) hypertension: Secondary | ICD-10-CM | POA: Diagnosis not present

## 2022-07-30 DIAGNOSIS — R0789 Other chest pain: Secondary | ICD-10-CM | POA: Insufficient documentation

## 2022-07-30 DIAGNOSIS — R079 Chest pain, unspecified: Secondary | ICD-10-CM | POA: Diagnosis not present

## 2022-07-30 LAB — BASIC METABOLIC PANEL
Anion gap: 7 (ref 5–15)
BUN: 18 mg/dL (ref 8–23)
CO2: 27 mmol/L (ref 22–32)
Calcium: 9.4 mg/dL (ref 8.9–10.3)
Chloride: 105 mmol/L (ref 98–111)
Creatinine, Ser: 0.91 mg/dL (ref 0.61–1.24)
GFR, Estimated: >60 mL/min (ref >60)
Glucose, Bld: 81 mg/dL (ref 70–99)
Potassium: 3.8 mmol/L (ref 3.5–5.1)
Sodium: 139 mmol/L (ref 135–145)

## 2022-07-30 LAB — CBC
HCT: 44.5 % (ref 39.0–52.0)
Hemoglobin: 15.4 g/dL (ref 13.0–17.0)
MCH: 30.8 pg (ref 26.0–34.0)
MCHC: 34.6 g/dL (ref 30.0–36.0)
MCV: 89 fL (ref 80.0–100.0)
Platelets: 196 10*3/uL (ref 150–400)
RBC: 5 MIL/uL (ref 4.22–5.81)
RDW: 13 % (ref 11.5–15.5)
WBC: 7 10*3/uL (ref 4.0–10.5)
nRBC: 0 % (ref 0.0–0.2)

## 2022-07-30 LAB — D-DIMER, QUANTITATIVE: D-Dimer, Quant: 0.36 ug/mL-FEU (ref 0.00–0.50)

## 2022-07-30 LAB — TROPONIN I (HIGH SENSITIVITY): Troponin I (High Sensitivity): 3 ng/L (ref <18)

## 2022-07-30 NOTE — Telephone Encounter (Signed)
FYI: This call has been transferred to Access Nurse. Once the result note has been entered staff can address the message at that time.  Patient called in with the following symptoms:  Red Word:chest pain  Pain in right chest area, inch or 2 above nipple - late last week - worse last 2 days especially when breathing in and out  Please advise at Mobile [phone] (mobile)  Message is routed to Provider Pool and [facility] Triage

## 2022-07-30 NOTE — Telephone Encounter (Signed)
Patient Advised Go to ED Now  Currently at [facility]   Patient Name First: Rodney Last: Warren Gender: Male DOB: [birth year] Age: 72 Y 8 M 11 D Return Phone Number: [phone] (Primary), [phone] (Secondary) Address: City/ State/ Zip: [place]  [address] Client [facility] - Administrator, [facility] [place] [facility] Provider Tana Conch- MD Contact Type Call Who Is Calling Patient / Member / Family / Caregiver Call Type Triage / Clinical Relationship To Patient Self Return Phone Number [phone] (Primary) Chief Complaint CHEST PAIN - pain, pressure, heaviness or tightness Reason for Call Symptomatic / Request for Health Information Initial Comment Caller states he is having pain in right chest area about a inch or two above nipple. It started last week but has been really bad the past 2 days when breathing and it's getting worse. GOTO Facility Not Listed New hospital near the office Translation No Nurse Assessment Nurse: D'Heur Ezzard Standing, RN, Adrienne Date/Time (Eastern Time): 07/30/2022 10:03:28 AM Confirm and document reason for call. If symptomatic, describe symptoms. ---Caller states he is having pain in right chest area about a inch or two above nipple. When breathing deeply, it's severe. He noticed the pain early last week, then 3 days ago it began to feel sore (not constant). Yesterday the pain got even worse. Does the patient have any new or worsening symptoms? ---Yes Will a triage be completed? ---Yes Related visit to physician within the last 2 weeks? ---No Does the PT have any chronic conditions? (i.e. diabetes, asthma, this includes High risk factors for pregnancy, etc.) ---Yes List chronic conditions. 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 ---No Guidelines Guideline Title Affirmed Question Affirmed Notes Nurse Date/Time  Lamount Cohen Time) Chest Pain Taking a deep breath makes pain worse D'Heur Ezzard Standing, RN, Adrienne 07/30/2022 10:07:03 AM  Disp. Time Lamount Cohen Time) Disposition Final User 07/30/2022 10:01:14 AM Send to Urgent Queue Enid Cutter 07/30/2022 10:15:12 AM Go to ED Now (or PCP triage) Yes D'Heur Ezzard Standing, RN, Adrienne Final Disposition 07/30/2022 10:15:12 AM Go to ED Now (or PCP triage) Yes D'Heur Ezzard Standing, RN, Maple [place] Disagree/Comply Comply Caller Understands Yes PreDisposition Call Doctor Care Advice Given Per Guideline GO TO ED NOW (OR PCP TRIAGE): CALL EMS IF: * Severe difficulty breathing occurs * Passes out or becomes too weak to stand * You become worse CARE ADVICE given per Chest Pain (Adult) guideline.  Comments User: Hansel Starling, D'Heur Ezzard Standing, RN Date/Time Lamount Cohen Time): 07/30/2022 10:08:41 AM Current pulse is 105. He usually runs around 75. Referrals GO TO FACILITY OTHER - SPECIFY

## 2022-07-30 NOTE — Telephone Encounter (Signed)
I will be out of office this afternoon-please follow-up on disposition and make sure patient gets the care he needs

## 2022-07-30 NOTE — ED Notes (Signed)
Patient verbalizes understanding of discharge instructions. Opportunity for questioning and answers were provided. Patient discharged from ED.  °

## 2022-07-30 NOTE — Telephone Encounter (Signed)
FYI

## 2022-07-30 NOTE — ED Provider Notes (Signed)
[place] EMERGENCY DEPARTMENT AT [facility] Provider Note   CSN: [id number] Arrival date & time: 07/30/22  1141     History  Chief Complaint  Patient presents with   Chest Pain    [name]. is a 72 y.o. male.  Patient is a 72 year old male with a past medical history of CAD status post CABG, hypertension and GERD presenting to the emergency department with chest pain.  The patient states on Thursday he was gardening and started to develop a mild discomfort on the right side of his chest just below the nipple.  He states that he thought nothing of it however the pain returned again on Monday.  He states it was still mild at this time.  He states that last night the pain started to become severe especially every time he took a deep breath.  He denies any other trauma or falls, fevers or chills, cough or congestion.  He denies any associated shortness of breath or lower extremity swelling.  He denies any recent hospitalizations or surgery, recent travel in the car or plane, hormone use or cancer history.  The history is provided by the patient.  Chest Pain      Home Medications Prior to Admission medications   Medication Sig Start Date End Date Taking? Authorizing Provider  aspirin EC 81 MG tablet Take 1 tablet (81 mg total) by mouth daily. 12/25/17   Lewayne Bunting, MD  atorvastatin (LIPITOR) 80 MG tablet Take 1 tablet (80 mg total) by mouth daily. 07/23/22   Lewayne Bunting, MD  calcium citrate-vitamin D (CITRACAL+D) 315-200 MG-UNIT per tablet 2 tablets. 2 tabs at bedtime    [provider]  Cholecalciferol (VITAMIN D3) 2000 UNITS TABS Take 1 tablet by mouth.     [provider]  Ferrous Sulfate Dried (IRON) 160 (50 FE) MG TBCR Take 1 tablet by mouth daily.    [provider]  magnesium oxide (MAG-OX) 400 MG tablet Take 400 mg by mouth 2 (two) times daily.    [provider]  Multiple Vitamin (MULTIVITAMIN) tablet Take 2  tablets by mouth 2 (two) times daily.    [provider]  Omega-3 Fatty Acids (OMEGA 3 PO) Take 2 capsules (3000 mg total) by mouth in the morning and take 1 capsule (1500 mg total) by mouth in the evening.    [provider]  vitamin B-12 (CYANOCOBALAMIN) 500 MCG tablet Take 500 mcg by mouth daily.    [provider]      Allergies    Patient has no known allergies.    Review of Systems   Review of Systems  Cardiovascular:  Positive for chest pain.    Physical Exam Updated Vital Signs BP (!) 146/99   Pulse 68   Temp 97.9 F (36.6 C) (Temporal)   Resp 13   Ht 5\' 10"  (1.778 m)   Wt 72.6 kg   SpO2 98%   BMI 22.96 kg/m  Physical Exam Vitals and nursing note reviewed.  Constitutional:      General: He is not in acute distress.    Appearance: He is well-developed.  HENT:     Head: Normocephalic and atraumatic.  Eyes:     Extraocular Movements: Extraocular movements intact.  Cardiovascular:     Rate and Rhythm: Normal rate and regular rhythm.     Pulses:          Radial pulses are 2+ on the right side and 2+ on  the left side.       Dorsalis pedis pulses are 2+ on the right side and 2+ on the left side.     Heart sounds: Normal heart sounds.  Pulmonary:     Effort: Pulmonary effort is normal.     Breath sounds: Normal breath sounds.  Chest:     Chest wall: Tenderness (Right mid chest wall, no overlying skin changes) present.  Abdominal:     Palpations: Abdomen is soft.     Tenderness: There is no abdominal tenderness.  Musculoskeletal:        General: Normal range of motion.     Cervical back: Normal range of motion and neck supple.     Right lower leg: No edema.     Left lower leg: No edema.  Skin:    General: Skin is warm and dry.  Neurological:     General: No focal deficit present.     Mental Status: He is alert and oriented to person, place, and time.  Psychiatric:        Mood and Affect: Mood normal.        Behavior: Behavior  normal.     ED Results / Procedures / Treatments   Labs (all labs ordered are listed, but only abnormal results are displayed) Labs Reviewed  CBC  BASIC METABOLIC PANEL  D-DIMER, QUANTITATIVE  TROPONIN I (HIGH SENSITIVITY)    EKG EKG Interpretation  Date/Time:  Wednesday July 30 2022 11:49:01 EDT Ventricular Rate:  80 PR Interval:  204 QRS Duration: 120 QT Interval:  354 QTC Calculation: 408 R Axis:   -6 Text Interpretation: Sinus rhythm with marked sinus arrhythmia Right bundle branch block Possible Anterior infarct , age undetermined Abnormal ECG When compared with ECG of 07-Jul-2012 09:47, Right bundle branch block has replaced Incomplete right bundle branch block Borderline criteria for Anterior infarct are now Present Confirmed by Elayne Snare ([id number]) on 07/30/2022 12:08:26 PM  Radiology DG Chest 2 View  Result Date: 07/30/2022 CLINICAL DATA:  Chest pain. EXAM: CHEST - 2 VIEW COMPARISON:  April 27, 2013. FINDINGS: The heart size and mediastinal contours are within normal limits. Status post coronary artery bypass graft. Moderate size hiatal hernia is noted. Both lungs are clear. The visualized skeletal structures are unremarkable. IMPRESSION: No active cardiopulmonary disease.  Moderate size hiatal hernia. Electronically Signed   By: Lupita Raider M.D.   On: 07/30/2022 13:19    Procedures Procedures    Medications Ordered in ED Medications - No data to display  ED Course/ Medical Decision Making/ A&P Clinical Course as of 07/30/22 1527  Wed Jul 30, 2022  1307 Initial troponin negative.  Symptoms have been ongoing for several days and acutely worsened last night so single troponin is sufficient.  D-dimer is negative making PE unlikely. [VK]  1344 CXR with hiatal hernia, with reproducible chest wall pain this is less likely causing his symptoms. [VK]    Clinical Course User Index [VK] Rexford Maus, DO                             Medical Decision  Making This patient presents to the ED with chief complaint(s) of chest pain with pertinent past medical history of CAD status post CABG, hypertension which further complicates the presenting complaint. The complaint involves an extensive differential diagnosis and also carries with it a high risk of complications and morbidity.    The differential diagnosis includes  ACS, arrhythmia, anemia, pneumonia, pneumothorax, pulmonary edema, pleural effusion, considering PE with pleuritic chest pain though he is low risk by Wells criteria, he has no radiation of pain and no neurologic deficits making dissection unlikely  Additional history obtained: Additional history obtained from N/A Records reviewed cardiology records  ED Course and Reassessment: On patient's arrival to the emergency department he is hemodynamically stable in no acute distress.  EKG on arrival showed normal sinus rhythm without acute ischemic changes.  Due to his cardiac risk factors he will have labs including troponin as well as D-dimer in the setting of his pleuritic pain and a chest x-ray and will be closely reassessed.  He declined any pain medication at this time.  Independent labs interpretation:  The following labs were independently interpreted: within normal range  Independent visualization of imaging: - I independently visualized the following imaging with scope of interpretation limited to determining acute life threatening conditions related to emergency care: CXR, which revealed no acute disease  Consultation: - Consulted or discussed management/test interpretation w/ external professional: N/A  Consideration for admission or further workup: Patient has no emergent conditions requiring admission or further work-up at this time and is stable for discharge home with primary care follow-up  Social Determinants of health: N/A    Amount and/or Complexity of Data Reviewed Labs: ordered. Radiology:  ordered.          Final Clinical Impression(s) / ED Diagnoses Final diagnoses:  Chest wall pain    Rx / DC Orders ED Discharge Orders     None         Rexford Maus, DO 07/30/22 1527

## 2022-07-30 NOTE — ED Notes (Signed)
Patient transported to X-ray 

## 2022-07-30 NOTE — ED Triage Notes (Signed)
Patient here POV from Home.  Noted Right Sided CP that began 5 Days ago. Minimal and then worsened Monday. Became Painful upon deep inspiration last PM. No SOB. Tender.  NAD Noted during Triage. A&Ox4. CGS 15. Ambulatory.

## 2022-07-30 NOTE — Discharge Instructions (Signed)
You were seen in the emergency department for chest pain.  Your workup showed no signs of heart attack or stress on your heart, no signs of blood clots and no abnormalities within your lungs.  You likely pulled a muscle in your chest wall and can take Tylenol as needed for pain or use ice or heat.  He can follow-up with your primary doctor to have your symptoms rechecked.  You should return to the emergency department if your pain is worsening or does not go away when you are not taking deep breaths, you have worsening shortness of breath, you pass out or if you have any other new or concerning symptoms.

## 2022-08-06 ENCOUNTER — Telehealth: Payer: Self-pay

## 2022-08-06 NOTE — Telephone Encounter (Signed)
Transition Care Management Unsuccessful Follow-up Telephone Call  Date of discharge and from where:  [facility] 6/19  Attempts:  1st Attempt  Reason for unsuccessful TCM follow-up call:  Unable to leave message   [facility] Guide, [facility] [phone] [address] [place], [place], [place] [address] Phone: [phone] Email: [email or web address]

## 2022-08-07 ENCOUNTER — Telehealth: Payer: Self-pay

## 2022-08-07 NOTE — Telephone Encounter (Signed)
Transition Care Management Unsuccessful Follow-up Telephone Call  Date of discharge and from where:  [facility] 6/19 Attempts:  2nd Attempt  Reason for unsuccessful TCM follow-up call:  Left voice message   [name] [facility] Guide, [facility] [phone] [address], [place], [place] [address] Phone: [phone] Email: [email or web address]       

## 2022-09-07 DIAGNOSIS — Z23 Encounter for immunization: Secondary | ICD-10-CM | POA: Diagnosis not present

## 2022-10-07 ENCOUNTER — Telehealth: Payer: Self-pay

## 2022-10-07 DIAGNOSIS — E7800 Pure hypercholesterolemia, unspecified: Secondary | ICD-10-CM

## 2022-10-07 DIAGNOSIS — I2510 Atherosclerotic heart disease of native coronary artery without angina pectoris: Secondary | ICD-10-CM | POA: Diagnosis not present

## 2022-10-07 NOTE — Telephone Encounter (Signed)
Patient in lab today and no orders.  Based on previous orders for Dr Jens Som and patient visit, Lipid and Hepatic panel.

## 2022-10-07 NOTE — Progress Notes (Signed)
HPI: FU coronary artery disease status post coronary bypassing graft in January 2009. Note at that time, he had a LIMA to the LAD, free RIMA to the obtuse marginal, and sequential saphenous vein graft to the first and second diagonals, as well as saphenous vein graft to the PDA. Note preoperative carotid Dopplers performed revealed no significant right or left internal carotid artery stenosis. Nuclear study 5/14 showed EF 63 with normal perfusion. Abd ultrasound 11/21 showed mildly dilated right common iliac; FU study as needed.  Patient seen June 2024 with complaints of chest pain felt secondary to chest wall pain.  D-dimer and troponin normal.  Since I last saw him, the patient denies any dyspnea on exertion, orthopnea, PND, pedal edema, palpitations, syncope or chest pain.   Current Outpatient Medications  Medication Sig Dispense Refill   aspirin EC 81 MG tablet Take 1 tablet (81 mg total) by mouth daily.     atorvastatin (LIPITOR) 80 MG tablet Take 1 tablet (80 mg total) by mouth daily. 90 tablet 0   calcium citrate-vitamin D (CITRACAL+D) 315-200 MG-UNIT per tablet 2 tablets. 2 tabs at bedtime     Cholecalciferol (VITAMIN D3) 2000 UNITS TABS Take 1 tablet by mouth.      Ferrous Sulfate Dried (IRON) 160 (50 FE) MG TBCR Take 1 tablet by mouth daily.     magnesium oxide (MAG-OX) 400 MG tablet Take 400 mg by mouth 2 (two) times daily.     Multiple Vitamin (MULTIVITAMIN) tablet Take 2 tablets by mouth 2 (two) times daily.     Omega-3 Fatty Acids (OMEGA 3 PO) Take 2 capsules (3000 mg total) by mouth in the morning and take 1 capsule (1500 mg total) by mouth in the evening.     vitamin B-12 (CYANOCOBALAMIN) 500 MCG tablet Take 500 mcg by mouth daily.     No current facility-administered medications for this visit.     Past Medical History:  Diagnosis Date   CAD (coronary artery disease)    Celiac sprue    GERD (gastroesophageal reflux disease)    Hyperlipidemia    Low back pain    in  1980s back would "go out" about once a year. got better after brace in 1985.   OTITIS EXTERNA, ACUTE, LEFT     Past Surgical History:  Procedure Laterality Date   APPENDECTOMY  02/11/1971   COLONOSCOPY     CORONARY ARTERY BYPASS GRAFT  02/11/2007   5 arteries   NASAL SEPTOPLASTY W/ TURBINOPLASTY  02/11/1983   deviated septum   SHOULDER SURGERY Left 02/11/1960   TONSILECTOMY, ADENOIDECTOMY, BILATERAL MYRINGOTOMY AND TUBES  02/11/1955   UPPER GASTROINTESTINAL ENDOSCOPY      Social History   Socioeconomic History   Marital status: Married    Spouse name: Not on file   Number of children: Not on file   Years of education: Not on file   Highest education level: Not on file  Occupational History   Not on file  Tobacco Use   Smoking status: Never   Smokeless tobacco: Never  Vaping Use   Vaping status: Never Used  Substance and Sexual Activity   Alcohol use: Yes    Alcohol/week: 3.0 standard drinks of alcohol    Types: 2 Glasses of wine, 1 Standard drinks or equivalent per week    Comment: occ   Drug use: No   Sexual activity: Not on file  Other Topics Concern   Not on file  Social History Narrative  Married 44 years in 2016. No children. 8 dogs- chihuahua and mixes of chihuahua      [employer]. 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 Plans to work until death but may cut hours back or maybe stop around 84. Minimum 74.       Hobbies: time with wife and dogs   Exercise in evenings- walk or run (limits due to knees), exercise bike and treadmill- 5x a week      Social Determinants of Health   Financial Resource Strain: Low Risk  (03/25/2022)   Overall Financial Resource Strain (CARDIA)    Difficulty of Paying Living Expenses: Not hard at all  Food Insecurity: No Food Insecurity (03/25/2022)   Hunger Vital Sign    Worried About Running Out of Food in the Last Year: Never true    Ran Out of Food in the Last Year:  Never true  Transportation Needs: No Transportation Needs (03/25/2022)   PRAPARE - Administrator, Civil Service (Medical): No    Lack of Transportation (Non-Medical): No  Physical Activity: Sufficiently Active (03/25/2022)   Exercise Vital Sign    Days of Exercise per Week: 4 days    Minutes of Exercise per Session: 40 min  Stress: No Stress Concern Present (03/25/2022)   [employer] of Occupational Health - Occupational Stress Questionnaire    Feeling of Stress : Only a little  Social Connections: Moderately Integrated (03/25/2022)   Social Connection and Isolation Panel [NHANES]    Frequency of Communication with Friends and Family: Once a week    Frequency of Social Gatherings with Friends and Family: More than three times a week    Attends Religious Services: Never    Database administrator or Organizations: Yes    Attends Banker Meetings: 1 to 4 times per year    Marital Status: Married  Catering manager Violence: Not At Risk (03/25/2022)   Humiliation, Afraid, Rape, and Kick questionnaire    Fear of Current or Ex-Partner: No    Emotionally Abused: No    Physically Abused: No    Sexually Abused: No    Family History  Problem Relation Age of Onset   Hypertension Mother    Heart attack Mother    Heart disease Father        CABG, uncles as well   Parkinson's disease Father        dementia potentially from parkinsons   Hypertension Father    Colon cancer Father 87   Colon polyps Sister    Hemochromatosis Brother    Esophageal cancer Neg Hx    Stomach cancer Neg Hx    Rectal cancer Neg Hx     ROS: no fevers or chills, productive cough, hemoptysis, dysphasia, odynophagia, melena, hematochezia, dysuria, hematuria, rash, seizure activity, orthopnea, PND, pedal edema, claudication. Remaining systems are negative.  Physical Exam: Well-developed well-nourished in no acute distress.  Skin is warm and dry.  HEENT is normal.  Neck is supple.   Chest is clear to auscultation with normal expansion.  Cardiovascular exam is regular rate and rhythm.  Abdominal exam nontender or distended. No masses palpated. Extremities show no edema. neuro grossly intact   A/P  1 coronary disease status post coronary bypass and graft-patient denies exertional chest pain.  Continue aspirin and statin.  2 hyperlipidemia-continue statin.  3 question history of abdominal aortic aneurysm-plan follow-up ultrasound November 2024.  Olga Millers, MD

## 2022-10-08 LAB — HEPATIC FUNCTION PANEL
ALT: 16 IU/L (ref 0–44)
AST: 23 IU/L (ref 0–40)
Albumin: 4.4 g/dL (ref 3.8–4.8)
Alkaline Phosphatase: 72 IU/L (ref 44–121)
Bilirubin Total: 0.8 mg/dL (ref 0.0–1.2)
Bilirubin, Direct: 0.25 mg/dL (ref 0.00–0.40)
Total Protein: 6.8 g/dL (ref 6.0–8.5)

## 2022-10-08 LAB — LIPID PANEL
Chol/HDL Ratio: 2.3 ratio (ref 0.0–5.0)
Cholesterol, Total: 108 mg/dL (ref 100–199)
HDL: 47 mg/dL (ref >39)
LDL Chol Calc (NIH): 43 mg/dL (ref 0–99)
Triglycerides: 97 mg/dL (ref 0–149)
VLDL Cholesterol Cal: 18 mg/dL (ref 5–40)

## 2022-10-16 ENCOUNTER — Ambulatory Visit: Payer: Medicare Other | Attending: Cardiology | Admitting: Cardiology

## 2022-10-16 ENCOUNTER — Encounter: Payer: Self-pay | Admitting: Cardiology

## 2022-10-16 VITALS — BP 122/84 | HR 70 | Ht 70.0 in | Wt 162.2 lb

## 2022-10-16 DIAGNOSIS — I714 Abdominal aortic aneurysm, without rupture, unspecified: Secondary | ICD-10-CM | POA: Insufficient documentation

## 2022-10-16 DIAGNOSIS — E7800 Pure hypercholesterolemia, unspecified: Secondary | ICD-10-CM | POA: Insufficient documentation

## 2022-10-16 DIAGNOSIS — I2510 Atherosclerotic heart disease of native coronary artery without angina pectoris: Secondary | ICD-10-CM | POA: Insufficient documentation

## 2022-10-16 MED ORDER — ATORVASTATIN CALCIUM 80 MG PO TABS: 80.0000 mg | ORAL_TABLET | Freq: Every day | ORAL | 3 refills | Status: DC

## 2022-10-16 NOTE — Patient Instructions (Addendum)
Medication Instructions:  - We refilled your Lipitor 80mg , once daily today.   *If you need a refill on your cardiac medications before your next appointment, please call your pharmacy*  .   Testing/Procedures: Your physician has requested that you have an abdominal aorta duplex. During this test, an ultrasound is used to evaluate the aorta. Allow 30 minutes for this exam. Do not eat after midnight the day before and avoid carbonated beverages    Follow-Up: At [facility], you and your health needs are our priority.  As part of our continuing mission to provide you with exceptional heart care, we have created designated Provider Care Teams.  These Care Teams include your primary Cardiologist (physician) and Advanced Practice Providers (APPs -  Physician Assistants and Nurse Practitioners) who all work together to provide you with the care you need, when you need it.  We recommend signing up for the patient portal called "MyChart".  Sign up information is provided on this After Visit Summary.  MyChart is used to connect with patients for Virtual Visits (Telemedicine).  Patients are able to view lab/test results, encounter notes, upcoming appointments, etc.  Non-urgent messages can be sent to your provider as well.   To learn more about what you can do with MyChart, go to [email or web address].    Your next appointment:   1 year(s)  The format for your next appointment:   In Person  Provider:   Dr. Jens Som

## 2022-10-21 ENCOUNTER — Ambulatory Visit (HOSPITAL_COMMUNITY)
Admission: RE | Admit: 2022-10-21 | Discharge: 2022-10-21 | Disposition: A | Discharge status: Home / Self Care | Payer: Medicare Other | Source: Ambulatory Visit | Attending: Cardiology | Admitting: Cardiology

## 2022-10-21 DIAGNOSIS — I714 Abdominal aortic aneurysm, without rupture, unspecified: Secondary | ICD-10-CM | POA: Insufficient documentation

## 2022-10-31 ENCOUNTER — Encounter: Payer: Self-pay | Admitting: *Deleted

## 2022-12-31 ENCOUNTER — Ambulatory Visit (INDEPENDENT_AMBULATORY_CARE_PROVIDER_SITE_OTHER): Payer: Medicare Other

## 2022-12-31 DIAGNOSIS — Z23 Encounter for immunization: Secondary | ICD-10-CM

## 2023-04-06 ENCOUNTER — Ambulatory Visit (INDEPENDENT_AMBULATORY_CARE_PROVIDER_SITE_OTHER): Payer: Medicare Other

## 2023-04-06 VITALS — BP 118/78 | HR 85 | Temp 97.7°F | Wt 163.2 lb

## 2023-04-06 DIAGNOSIS — Z0000 Encounter for general adult medical examination without abnormal findings: Secondary | ICD-10-CM | POA: Diagnosis not present

## 2023-04-06 NOTE — Patient Instructions (Signed)
 Mr. Miske , Thank you for taking time to come for your Medicare Wellness Visit. I appreciate your ongoing commitment to your health goals. Please review the following plan we discussed and let me know if I can assist you in the future.   Referrals/Orders/Follow-Ups/Clinician Recommendations: Maintain health and activity   This is a list of the screening recommended for you and due dates:  Health Maintenance  Topic Date Due   COVID-19 Vaccine (7 - 2024-25 season) 11/02/2022   Medicare Annual Wellness Visit  04/05/2024   Colon Cancer Screening  10/10/2025   DTaP/Tdap/Td vaccine (4 - Td or Tdap) 04/13/2028   Pneumonia Vaccine  Completed   Flu Shot  Completed   Hepatitis C Screening  Completed   Zoster (Shingles) Vaccine  Completed   HPV Vaccine  Aged Out    Advanced directives: (Copy Requested) Please bring a copy of your health care power of attorney and living will to the office to be added to your chart at your convenience.  Next Medicare Annual Wellness Visit scheduled for next year: Yes

## 2023-04-06 NOTE — Progress Notes (Addendum)
 Subjective:   [name]. is a 73 y.o. who presents for a Medicare Wellness preventive visit.  Visit Complete: In person    AWV Questionnaire: Yes: Patient Medicare AWV questionnaire was completed by the patient on 04/04/23; I have confirmed that all information answered by patient is correct and no changes since this date.  Cardiac Risk Factors include: dyslipidemia;male gender;advanced age (>66men, >86 women);hypertension     Objective:    Today's Vitals   04/06/23 0838  BP: 118/78  Pulse: 85  Temp: 97.7 F (36.5 C)  SpO2: 94%  Weight: 163 lb 3.2 oz (74 kg)   Body mass index is 23.42 kg/m.     04/06/2023    8:46 AM 07/30/2022   11:51 AM 03/25/2022   11:56 AM 03/04/2019    8:51 AM 03/23/2015    4:45 PM  Advanced Directives  Does Patient Have a Medical Advance Directive? Yes No No Yes Yes  Type of Estate agent of [place];Living will   Living will;Healthcare Power of Attorney Living will;Healthcare Power of Attorney  Does patient want to make changes to medical advance directive?    No - Patient declined   Copy of Healthcare Power of Attorney in Chart? No - copy requested   No - copy requested   Would patient like information on creating a medical advance directive?  No - Patient declined No - Patient declined      Current Medications (verified) Outpatient Encounter Medications as of 04/06/2023  Medication Sig   aspirin EC 81 MG tablet Take 1 tablet (81 mg total) by mouth daily.   atorvastatin (LIPITOR) 80 MG tablet Take 1 tablet (80 mg total) by mouth daily.   calcium citrate-vitamin D (CITRACAL+D) 315-200 MG-UNIT per tablet 2 tablets. 2 tabs at bedtime   Cholecalciferol (VITAMIN D3) 2000 UNITS TABS Take 1 tablet by mouth.    Ferrous Sulfate Dried (IRON) 160 (50 FE) MG TBCR Take 1 tablet by mouth daily.   magnesium oxide (MAG-OX) 400 MG tablet Take 400 mg by mouth 2 (two) times daily.   Multiple Vitamin (MULTIVITAMIN) tablet Take 2 tablets  by mouth 2 (two) times daily.   Omega-3 Fatty Acids (OMEGA 3 PO) Take 2 capsules (3000 mg total) by mouth in the morning and take 1 capsule (1500 mg total) by mouth in the evening.   vitamin B-12 (CYANOCOBALAMIN) 500 MCG tablet Take 500 mcg by mouth daily.   No facility-administered encounter medications on file as of 04/06/2023.    Allergies (verified) Patient has no known allergies.   History: Past Medical History:  Diagnosis Date   CAD (coronary artery disease)    Celiac sprue    GERD (gastroesophageal reflux disease)    Hyperlipidemia    Low back pain    in 1980s back would "go out" about once a year. got better after brace in 1985.   OTITIS EXTERNA, ACUTE, LEFT    Past Surgical History:  Procedure Laterality Date   APPENDECTOMY  02/11/1971   COLONOSCOPY     CORONARY ARTERY BYPASS GRAFT  02/11/2007   5 arteries   NASAL SEPTOPLASTY W/ TURBINOPLASTY  02/11/1983   deviated septum   SHOULDER SURGERY Left 02/11/1960   TONSILECTOMY, ADENOIDECTOMY, BILATERAL MYRINGOTOMY AND TUBES  02/11/1955   UPPER GASTROINTESTINAL ENDOSCOPY     Family History  Problem Relation Age of Onset   Hypertension Mother    Heart attack Mother    Heart disease Father  CABG, uncles as well   Parkinson's disease Father        dementia potentially from parkinsons   Hypertension Father    Colon cancer Father 75   Colon polyps Sister    Hemochromatosis Brother    Esophageal cancer Neg Hx    Stomach cancer Neg Hx    Rectal cancer Neg Hx    Social History   Socioeconomic History   Marital status: Married    Spouse name: Not on file   Number of children: Not on file   Years of education: Not on file   Highest education level: Not on file  Occupational History   Not on file  Tobacco Use   Smoking status: Never   Smokeless tobacco: Never  Vaping Use   Vaping status: Never Used  Substance and Sexual Activity   Alcohol use: Yes    Alcohol/week: 3.0 standard drinks of alcohol    Types:  2 Glasses of wine, 1 Standard drinks or equivalent per week    Comment: occ   Drug use: No   Sexual activity: Not on file  Other Topics Concern   Not on file  Social History Narrative   Married 44 years in 2016. No children. 8 dogs- chihuahua and mixes of chihuahua      [employer]. 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 Plans to work until death but may cut hours back or maybe stop around 84. Minimum 74.       Hobbies: time with wife and dogs   Exercise in evenings- walk or run (limits due to knees), exercise bike and treadmill- 5x a week      Social Drivers of Corporate investment banker Strain: Low Risk  (04/06/2023)   Overall Financial Resource Strain (CARDIA)    Difficulty of Paying Living Expenses: Not hard at all  Food Insecurity: No Food Insecurity (04/06/2023)   Hunger Vital Sign    Worried About Running Out of Food in the Last Year: Never true    Ran Out of Food in the Last Year: Never true  Transportation Needs: No Transportation Needs (04/06/2023)   PRAPARE - Administrator, Civil Service (Medical): No    Lack of Transportation (Non-Medical): No  Physical Activity: Insufficiently Active (04/06/2023)   Exercise Vital Sign    Days of Exercise per Week: 4 days    Minutes of Exercise per Session: 30 min  Stress: No Stress Concern Present (04/06/2023)   [employer] of Occupational Health - Occupational Stress Questionnaire    Feeling of Stress : Not at all  Social Connections: Moderately Integrated (04/06/2023)   Social Connection and Isolation Panel [NHANES]    Frequency of Communication with Friends and Family: More than three times a week    Frequency of Social Gatherings with Friends and Family: More than three times a week    Attends Religious Services: Never    Database administrator or Organizations: Yes    Attends Engineer, structural: 1 to 4 times per year    Marital Status: Married     Tobacco Counseling Counseling given: Not Answered    Clinical Intake:  Pre-visit preparation completed: Yes  Pain : No/denies pain     BMI - recorded: 23.42 Nutritional Status: BMI of 19-24  Normal Nutritional Risks: None Diabetes: No  How often do you need to have someone help you when you read instructions, pamphlets, or other written materials from your  doctor or pharmacy?: 1 - Never  Interpreter Needed?: No  Information entered by :: Lanier Ensign, LPN   Activities of Daily Living     04/04/2023    8:30 AM  In your present state of health, do you have any difficulty performing the following activities:  Hearing? 0  Vision? 0  Difficulty concentrating or making decisions? 0  Walking or climbing stairs? 0  Dressing or bathing? 0  Doing errands, shopping? 0  Preparing Food and eating ? N  Using the Toilet? N  In the past six months, have you accidently leaked urine? N  Do you have problems with loss of bowel control? N  Managing your Medications? N  Managing your Finances? N  Housekeeping or managing your Housekeeping? N    Patient Care Team: Shelva Majestic, MD as PCP - General (Family Medicine) Jens Som Madolyn Frieze, MD as Consulting Physician (Cardiology) Arminda Resides, MD as Consulting Physician (Dermatology)  Indicate any recent Medical Services you may have received from other than [facility] providers in the past year (date may be approximate).     Assessment:   This is a routine wellness examination for Shaine.  Hearing/Vision screen Hearing Screening - Comments:: Pt denies any hearing issues  Vision Screening - Comments:: Pt follows up with dr Blima Ledger for annual eye exams    Goals Addressed             This Visit's Progress    Patient Stated       Maintain health and activity        Depression Screen     04/06/2023    8:47 AM 03/25/2022   11:55 AM 07/05/2021    8:56 AM 06/29/2020    2:16 PM 03/04/2019    8:54 AM 04/12/2018    10:01 AM 10/27/2016    9:35 AM  PHQ 2/9 Scores  PHQ - 2 Score 0 0 0 0 0 0 0  PHQ- 9 Score   0 0       Fall Risk     04/04/2023    8:30 AM 03/25/2022   11:57 AM 07/05/2021    8:56 AM 06/29/2020    2:16 PM 03/04/2019    8:53 AM  Fall Risk   Falls in the past year? 0 0 0 1 0  Number falls in past yr: 0 0 0 0 0  Injury with Fall? 0 0 0 1 0  Risk for fall due to : No Fall Risks Impaired vision No Fall Risks    Follow up Falls prevention discussed Falls prevention discussed Falls evaluation completed  Falls evaluation completed;Education provided;Falls prevention discussed    MEDICARE RISK AT HOME:  Medicare Risk at Home Any stairs in or around the home?: (Patient-Rptd) Yes If so, are there any without handrails?: (Patient-Rptd) No Home free of loose throw rugs in walkways, pet beds, electrical cords, etc?: (Patient-Rptd) No Adequate lighting in your home to reduce risk of falls?: (Patient-Rptd) Yes Life alert?: (Patient-Rptd) No Use of a cane, walker or w/c?: (Patient-Rptd) No Grab bars in the bathroom?: (Patient-Rptd) No Shower chair or bench in shower?: (Patient-Rptd) No Elevated toilet seat or a handicapped toilet?: (Patient-Rptd) No  TIMED UP AND GO:  Was the test performed?  Yes  Length of time to ambulate 10 feet: 10 sec Gait steady and fast without use of assistive device  Cognitive Function: 6CIT completed        04/06/2023    8:50 AM 03/25/2022   11:58  AM 03/04/2019    8:53 AM  6CIT Screen  What Year? 0 points 0 points 0 points  What month? 0 points 0 points 0 points  What time? 0 points 0 points 0 points  Count back from 20 0 points 0 points 0 points  Months in reverse 0 points 0 points 0 points  Repeat phrase 0 points 0 points 0 points  Total Score 0 points 0 points 0 points    Immunizations Immunization History  Administered Date(s) Administered   Fluad Quad(high Dose 65+) 11/26/2018, 12/11/2020, 12/18/2021   Fluad Trivalent(High Dose 65+) 12/31/2022    Influenza, High Dose Seasonal PF 12/21/2015, 11/24/2016, 12/01/2017   Influenza,inj,Quad PF,6+ Mos 01/05/2013, 11/24/2013, 12/28/2014   Influenza-Unspecified 11/14/2019   Moderna Covid-19 Fall Seasonal Vaccine 34yrs & older 09/07/2022   PFIZER(Purple Top)SARS-COV-2 Vaccination 04/03/2019, 04/27/2019, 11/21/2019, 07/17/2020   Pfizer Covid-19 Vaccine Bivalent Booster 48yrs & up 11/27/2020   Pneumococcal Conjugate-13 01/19/2015   Pneumococcal Polysaccharide-23 04/06/2017   Td 06/14/1996, 02/19/2007   Tdap 04/14/2018   Zoster Recombinant(Shingrix) 06/20/2017, 11/28/2017   Zoster, Live 06/07/2012    Screening Tests Health Maintenance  Topic Date Due   COVID-19 Vaccine (7 - 2024-25 season) 11/02/2022   Medicare Annual Wellness (AWV)  04/05/2024   Colonoscopy  10/10/2025   DTaP/Tdap/Td (4 - Td or Tdap) 04/13/2028   Pneumonia Vaccine 74+ Years old  Completed   INFLUENZA VACCINE  Completed   Hepatitis C Screening  Completed   Zoster Vaccines- Shingrix  Completed   HPV VACCINES  Aged Out    Health Maintenance  Health Maintenance Due  Topic Date Due   COVID-19 Vaccine (7 - 2024-25 season) 11/02/2022   Health Maintenance Items Addressed:    Additional Screening:  Vision Screening: Recommended annual ophthalmology exams for early detection of glaucoma and other disorders of the eye.  Dental Screening: Recommended annual dental exams for proper oral hygiene  Community Resource Referral / Chronic Care Management: CRR required this visit?  No   CCM required this visit?  No     Plan:     I have personally reviewed and noted the following in the patient's chart:   Medical and social history Use of alcohol, tobacco or illicit drugs  Current medications and supplements including opioid prescriptions. Patient is not currently taking opioid prescriptions. Functional ability and status Nutritional status Physical activity Advanced directives List of other  physicians Hospitalizations, surgeries, and ER visits in previous 12 months Vitals Screenings to include cognitive, depression, and falls Referrals and appointments  In addition, I have reviewed and discussed with patient certain preventive protocols, quality metrics, and best practice recommendations. A written personalized care plan for preventive services as well as general preventive health recommendations were provided to patient.     Marzella Schlein, LPN   09/20/9145   After Visit Summary: (MyChart) Due to this being a telephonic visit, the after visit summary with patients personalized plan was offered to patient via MyChart   Notes: Nothing significant to report at this time.

## 2023-05-01 ENCOUNTER — Ambulatory Visit: Payer: Self-pay

## 2023-05-01 NOTE — Telephone Encounter (Signed)
 Noted.

## 2023-05-01 NOTE — Telephone Encounter (Signed)
 Copied from CRM [id number]. Topic: Clinical - Red Word Triage >> May 01, 2023 10:33 AM Mackie Pai E wrote: [facility] that prompted transfer to Nurse Triage: Pain in foot. Patient noticed a nodule on the arch of his right foot this past Wednesday 3/19. Patient rated the pain a 3 when wearing cushioned shoes, a 6 when walking barefoot, and then a 9 when he is directly pressing on that nodule (with a score of 10 being extreme pain).  Chief Complaint: right foot arch "nodule" Symptoms: pain Frequency: started on Wednesday Pertinent Negatives: Patient denies fever, numbness Disposition: [] ED /[] Urgent Care (no appt availability in office) / [x] Appointment(In office/virtual)/ []  [place] [facility]/ [] Home Care/ [] Refused Recommended Disposition /[] [place] [facility]/ []  Follow-up with PCP Additional Notes: per protocol apt set for Monday.  Care advice given, denies questions; instructed to go to ER if becomes worse.   Reason for Disposition  [1] Swelling is painful to touch AND [2] no fever  Answer Assessment - Initial Assessment Questions 1. APPEARANCE of SWELLING: "What does it look like?"     Right foot has "nodule" on the arch about an  2. SIZE: "How large is the swelling?" (e.g., inches, cm; or compare to size of pinhead, tip of pen, eraser, coin, pea, grape, ping pong ball)      1/2 an inch 3. LOCATION: "Where is the swelling located?"     wed 4. ONSET: "When did the swelling start?"     wed 5. COLOR: "What color is it?" "Is there more than one color?"     Flesh colored 6. PAIN: "Is there any pain?" If Yes, ask: "How bad is the pain?" (e.g., scale 1-10; or mild, moderate, severe)     - NONE (0): no pain   - MILD (1-3): doesn't interfere with normal activities    - MODERATE (4-7): interferes with normal activities or awakens from sleep    - SEVERE (8-10): excruciating pain, unable to do any normal activities     9/10 when pressing on it. 7. ITCH: "Does it itch?" If Yes, ask: "How  bad is the itch?"      denies 8. CAUSE: "What do you think caused the swelling?" unknown 9 OTHER SYMPTOMS: "Do you have any other symptoms?" (e.g., fever)     Denies.  Protocols used: Skin Lump or Localized Swelling-A-AH

## 2023-05-04 ENCOUNTER — Ambulatory Visit (INDEPENDENT_AMBULATORY_CARE_PROVIDER_SITE_OTHER): Admitting: Family Medicine

## 2023-05-04 ENCOUNTER — Encounter: Payer: Self-pay | Admitting: Family Medicine

## 2023-05-04 VITALS — BP 131/89 | HR 84 | Temp 97.3°F | Ht 70.0 in | Wt 161.8 lb

## 2023-05-04 DIAGNOSIS — M79671 Pain in right foot: Secondary | ICD-10-CM | POA: Diagnosis not present

## 2023-05-04 NOTE — Progress Notes (Signed)
 Subjective:     Patient ID: Rodney Conran., male    DOB: 1950/04/05, 73 y.o.   MRN: [id number]  Chief Complaint  Patient presents with   Foot Injury    Last Wed - pt c/o of pain in arch of right foot, Thursday pain was worse, pt noticed a nodule. Pt has noticed today the nodule has not changed.     HPI Discussed the use of AI scribe software for clinical note transcription with the patient, who gave verbal consent to proceed.  History of Present Illness [name]. "[name]" is a 73 year old male who presents with right foot pain.  He has been experiencing right foot pain for the past five to six days. 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 Initially, the pain was severe, and a nodule was visibly protruding, causing the skin to elevate. As of yesterday, the nodule is less noticeable, though still palpable. The pain persists when pressure is applied, but he can walk without pain when wearing padded shoes. No recent injury or trauma to the foot, such as twisting or stepping on something. No numbness or tingling, and the pain is not present when the foot is not in contact with the floor due to the arch.  He recalls an episode from last year when he experienced severe chest pain, which he described as a strain around the chest area. He was confident it was not heart-related but was advised to visit the ER by a nurse. At the ER, he underwent extensive testing, including x-rays, and was diagnosed with a muscle strain, likely due to outdoor work and lifting. He was relieved that all tests were negative and considered it a comprehensive check-up.  He mentions having a home gym with an exercise bike, treadmill, and weights, which he uses to stay active. He has a room dedicated to exercise in his house.     There are no preventive care reminders to display for this patient.   Past Medical History:  Diagnosis Date   CAD  (coronary artery disease)    Celiac sprue    GERD (gastroesophageal reflux disease)    Hyperlipidemia    Low back pain    in 1980s back would "go out" about once a year. got better after brace in 1985.   OTITIS EXTERNA, ACUTE, LEFT     Past Surgical History:  Procedure Laterality Date   APPENDECTOMY  02/11/1971   COLONOSCOPY     CORONARY ARTERY BYPASS GRAFT  02/11/2007   5 arteries   NASAL SEPTOPLASTY W/ TURBINOPLASTY  02/11/1983   deviated septum   SHOULDER SURGERY Left 02/11/1960   TONSILECTOMY, ADENOIDECTOMY, BILATERAL MYRINGOTOMY AND TUBES  02/11/1955   UPPER GASTROINTESTINAL ENDOSCOPY       Current Outpatient Medications:    aspirin EC 81 MG tablet, Take 1 tablet (81 mg total) by mouth daily., Disp: , Rfl:    atorvastatin (LIPITOR) 80 MG tablet, Take 1 tablet (80 mg total) by mouth daily., Disp: 90 tablet, Rfl: 3   calcium citrate-vitamin D (CITRACAL+D) 315-200 MG-UNIT per tablet, 2 tablets. 2 tabs at bedtime, Disp: , Rfl:    Cholecalciferol (VITAMIN D3) 2000 UNITS TABS, Take 1 tablet by mouth. , Disp: , Rfl:    Ferrous Sulfate Dried (IRON) 160 (50 FE) MG TBCR, Take 1 tablet by mouth daily., Disp: , Rfl:    magnesium oxide (MAG-OX) 400 MG tablet, Take 400 mg  by mouth 2 (two) times daily., Disp: , Rfl:    Multiple Vitamin (MULTIVITAMIN) tablet, Take 2 tablets by mouth 2 (two) times daily., Disp: , Rfl:    Omega-3 Fatty Acids (OMEGA 3 PO), Take 2 capsules (3000 mg total) by mouth in the morning and take 1 capsule (1500 mg total) by mouth in the evening., Disp: , Rfl:    vitamin B-12 (CYANOCOBALAMIN) 500 MCG tablet, Take 500 mcg by mouth daily., Disp: , Rfl:   No Known Allergies ROS neg/noncontributory except as noted HPI/below      Objective:     BP 131/89   Pulse 84   Temp (!) 97.3 F (36.3 C)   Ht 5\' 10"  (1.778 m)   Wt 161 lb 12.8 oz (73.4 kg)   SpO2 98%   BMI 23.22 kg/m  Wt Readings from Last 3 Encounters:  05/04/23 161 lb 12.8 oz (73.4 kg)  04/06/23 163 lb  3.2 oz (74 kg)  10/16/22 162 lb 3.2 oz (73.6 kg)    Physical Exam   Gen: WDWN NAD HEENT: NCAT, conjunctiva not injected, sclera nonicteric EXT:  no edema MSK: no gross abnormalities.  NEURO: A&O x3.  CN II-XII intact.  PSYCH: normal mood. Good eye contact  R foot-DP 2+.  Good rom.  Some TTP and nodule near MTP 2.       Assessment & Plan:  Right foot pain  Assessment and Plan Assessment & Plan Plantar Fibroma   He reports a 5-6 day history of right foot pain, self-diagnosed as plantar fibroma. Initially, a nodule was visible and caused significant pain, but it has since become less pronounced and less painful, especially when wearing padded shoes. No specific injury or mechanism of injury identified. Considered the possibility of a neuroma or tendon irritation. The condition may resolve spontaneously or become more inflamed. Recommend Voltaren gel for local anti-inflammatory effect. Advise stretching exercises and wearing supportive shoes. Provide contact information for sports medicine specialists for further evaluation if symptoms worsen.  Chest Pain   He experienced severe chest pain last year, evaluated in the ER. Extensive testing ruled out cardiac causes, diagnosed as muscle strain likely due to physical activity. Reassured by negative test results and non-cardiac diagnosis.  General Health Maintenance   He has not had an annual exam since May 2023. Generally feeling well and stays active with home exercise equipment. Schedule an appointment for an annual exam with Dr. Durene Cal.    Return if symptoms worsen or fail to improve.  Angelena Sole, MD

## 2023-05-04 NOTE — Patient Instructions (Signed)
 [facility]  [address] on the 1st floor Phone number [phone]    Voltaren gel.   Stretches.

## 2023-06-23 DIAGNOSIS — H18503 Unspecified hereditary corneal dystrophies, bilateral: Secondary | ICD-10-CM | POA: Diagnosis not present

## 2023-06-23 DIAGNOSIS — H25013 Cortical age-related cataract, bilateral: Secondary | ICD-10-CM | POA: Diagnosis not present

## 2023-06-23 DIAGNOSIS — D3132 Benign neoplasm of left choroid: Secondary | ICD-10-CM | POA: Diagnosis not present

## 2023-06-23 DIAGNOSIS — H53143 Visual discomfort, bilateral: Secondary | ICD-10-CM | POA: Diagnosis not present

## 2023-07-27 DIAGNOSIS — Z23 Encounter for immunization: Secondary | ICD-10-CM | POA: Diagnosis not present

## 2023-09-17 DIAGNOSIS — L821 Other seborrheic keratosis: Secondary | ICD-10-CM | POA: Diagnosis not present

## 2023-09-17 DIAGNOSIS — D225 Melanocytic nevi of trunk: Secondary | ICD-10-CM | POA: Diagnosis not present

## 2023-09-17 DIAGNOSIS — C4372 Malignant melanoma of left lower limb, including hip: Secondary | ICD-10-CM | POA: Diagnosis not present

## 2023-09-17 DIAGNOSIS — Z85828 Personal history of other malignant neoplasm of skin: Secondary | ICD-10-CM | POA: Diagnosis not present

## 2023-09-17 DIAGNOSIS — D485 Neoplasm of uncertain behavior of skin: Secondary | ICD-10-CM | POA: Diagnosis not present

## 2023-09-17 DIAGNOSIS — L570 Actinic keratosis: Secondary | ICD-10-CM | POA: Diagnosis not present

## 2023-09-17 DIAGNOSIS — L814 Other melanin hyperpigmentation: Secondary | ICD-10-CM | POA: Diagnosis not present

## 2023-09-17 DIAGNOSIS — L920 Granuloma annulare: Secondary | ICD-10-CM | POA: Diagnosis not present

## 2023-09-17 DIAGNOSIS — L301 Dyshidrosis [pompholyx]: Secondary | ICD-10-CM | POA: Diagnosis not present

## 2023-10-01 DIAGNOSIS — C4370 Malignant melanoma of unspecified lower limb, including hip: Secondary | ICD-10-CM | POA: Diagnosis not present

## 2023-10-05 DIAGNOSIS — D485 Neoplasm of uncertain behavior of skin: Secondary | ICD-10-CM | POA: Diagnosis not present

## 2023-10-05 DIAGNOSIS — I2510 Atherosclerotic heart disease of native coronary artery without angina pectoris: Secondary | ICD-10-CM | POA: Diagnosis not present

## 2023-10-05 DIAGNOSIS — E785 Hyperlipidemia, unspecified: Secondary | ICD-10-CM | POA: Diagnosis not present

## 2023-10-15 DIAGNOSIS — C4372 Malignant melanoma of left lower limb, including hip: Secondary | ICD-10-CM | POA: Diagnosis not present

## 2023-10-16 DIAGNOSIS — K219 Gastro-esophageal reflux disease without esophagitis: Secondary | ICD-10-CM | POA: Diagnosis not present

## 2023-10-16 DIAGNOSIS — Z85820 Personal history of malignant melanoma of skin: Secondary | ICD-10-CM | POA: Diagnosis not present

## 2023-10-16 DIAGNOSIS — Z951 Presence of aortocoronary bypass graft: Secondary | ICD-10-CM | POA: Diagnosis not present

## 2023-10-16 DIAGNOSIS — R239 Unspecified skin changes: Secondary | ICD-10-CM | POA: Diagnosis not present

## 2023-10-16 DIAGNOSIS — C4372 Malignant melanoma of left lower limb, including hip: Secondary | ICD-10-CM | POA: Diagnosis not present

## 2023-10-16 DIAGNOSIS — E785 Hyperlipidemia, unspecified: Secondary | ICD-10-CM | POA: Diagnosis not present

## 2023-10-29 DIAGNOSIS — C4372 Malignant melanoma of left lower limb, including hip: Secondary | ICD-10-CM | POA: Diagnosis not present

## 2023-11-19 ENCOUNTER — Other Ambulatory Visit: Payer: Self-pay | Admitting: Cardiology

## 2023-11-26 DIAGNOSIS — C4372 Malignant melanoma of left lower limb, including hip: Secondary | ICD-10-CM | POA: Diagnosis not present

## 2023-12-19 ENCOUNTER — Other Ambulatory Visit: Payer: Self-pay | Admitting: Cardiology

## 2023-12-30 ENCOUNTER — Other Ambulatory Visit: Payer: Self-pay | Admitting: Cardiology

## 2023-12-31 ENCOUNTER — Telehealth: Payer: Self-pay | Admitting: Cardiology

## 2023-12-31 ENCOUNTER — Ambulatory Visit (INDEPENDENT_AMBULATORY_CARE_PROVIDER_SITE_OTHER)

## 2023-12-31 DIAGNOSIS — E7800 Pure hypercholesterolemia, unspecified: Secondary | ICD-10-CM

## 2023-12-31 DIAGNOSIS — Z23 Encounter for immunization: Secondary | ICD-10-CM | POA: Diagnosis not present

## 2023-12-31 DIAGNOSIS — I2510 Atherosclerotic heart disease of native coronary artery without angina pectoris: Secondary | ICD-10-CM

## 2023-12-31 NOTE — Telephone Encounter (Signed)
 Pt called in asking if he can have lab orders placed prior to his appt 02/23/24.

## 2024-01-05 NOTE — Telephone Encounter (Signed)
 Spoke with pt, his last labs were done 09/2022. Lab orders mailed to the pt

## 2024-01-21 DIAGNOSIS — L920 Granuloma annulare: Secondary | ICD-10-CM | POA: Diagnosis not present

## 2024-01-21 DIAGNOSIS — Z85820 Personal history of malignant melanoma of skin: Secondary | ICD-10-CM | POA: Diagnosis not present

## 2024-01-21 DIAGNOSIS — D225 Melanocytic nevi of trunk: Secondary | ICD-10-CM | POA: Diagnosis not present

## 2024-01-21 DIAGNOSIS — L738 Other specified follicular disorders: Secondary | ICD-10-CM | POA: Diagnosis not present

## 2024-01-21 DIAGNOSIS — Z85828 Personal history of other malignant neoplasm of skin: Secondary | ICD-10-CM | POA: Diagnosis not present

## 2024-01-21 DIAGNOSIS — L905 Scar conditions and fibrosis of skin: Secondary | ICD-10-CM | POA: Diagnosis not present

## 2024-02-16 NOTE — Progress Notes (Signed)
 "    HPI: FU coronary artery disease status post coronary bypassing graft in January 2009. Note at that time, he had a LIMA to the LAD, free RIMA to the obtuse marginal, and sequential saphenous vein graft to the first and second diagonals, as well as saphenous vein graft to the PDA. Note preoperative carotid Dopplers performed revealed no significant right or left internal carotid artery stenosis. Nuclear study 5/14 showed EF 63 with normal perfusion. 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  Since I last saw him, there is no dyspnea, chest pain, palpitations or syncope.  Current Outpatient Medications  Medication Sig Dispense Refill   aspirin EC 81 MG tablet Take 1 tablet (81 mg total) by mouth daily.     atorvastatin  (LIPITOR) 80 MG tablet TAKE 1 TABLET BY MOUTH EVERY DAY 90 tablet 0   calcium  citrate-vitamin D (CITRACAL+D) 315-200 MG-UNIT per tablet 2 tablets. 2 tabs at bedtime     Cholecalciferol (VITAMIN D3) 2000 UNITS TABS Take 1 tablet by mouth.      Ferrous Sulfate Dried (IRON) 160 (50 FE) MG TBCR Take 1 tablet by mouth daily.     magnesium oxide (MAG-OX) 400 MG tablet Take 400 mg by mouth 2 (two) times daily.     Multiple Vitamin (MULTIVITAMIN) tablet Take 2 tablets by mouth 2 (two) times daily.     Omega-3 Fatty Acids (OMEGA 3 PO) Take 2 capsules (3000 mg total) by mouth in the morning and take 1 capsule (1500 mg total) by mouth in the evening.     vitamin B-12 (CYANOCOBALAMIN ) 500 MCG tablet Take 500 mcg by mouth daily.     No current facility-administered medications for this visit.     Past Medical History:  Diagnosis Date   CAD (coronary artery disease)    Celiac sprue    GERD (gastroesophageal reflux disease)    Hyperlipidemia    Low back pain    in 1980s back would go out about once a year. got better after brace in 1985.   OTITIS EXTERNA, ACUTE, LEFT     Past Surgical History:  Procedure Laterality Date   APPENDECTOMY  02/11/1971    COLONOSCOPY     CORONARY ARTERY BYPASS GRAFT  02/11/2007   5 arteries   NASAL SEPTOPLASTY W/ TURBINOPLASTY  02/11/1983   deviated septum   SHOULDER SURGERY Left 02/11/1960   TONSILECTOMY, ADENOIDECTOMY, BILATERAL MYRINGOTOMY AND TUBES  02/11/1955   UPPER GASTROINTESTINAL ENDOSCOPY      Social History   Socioeconomic History   Marital status: Married    Spouse name: Not on file   Number of children: Not on file   Years of education: Not on file   Highest education level: Professional school degree (e.g., MD, DDS, DVM, JD)  Occupational History   Not on file  Tobacco Use   Smoking status: Never   Smokeless tobacco: Never  Vaping Use   Vaping status: Never Used  Substance and Sexual Activity   Alcohol use: Yes    Alcohol/week: 3.0 standard drinks of alcohol    Types: 2 Glasses of wine, 1 Standard drinks or equivalent per week    Comment: occ   Drug use: No   Sexual activity: Not on file  Other Topics Concern   Not on file  Social History Narrative   Married 44 years in 2016. No children. 8 dogs- chihuahua and mixes of chihuahua      [employer]. 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 Plans to work until death but may cut hours back or maybe stop around 84. Minimum 74.       Hobbies: time with wife and dogs   Exercise in evenings- walk or run (limits due to knees), exercise bike and treadmill- 5x a week      Social Drivers of Health   Tobacco Use: Low Risk (02/23/2024)   Patient History    Smoking Tobacco Use: Never    Smokeless Tobacco Use: Never    Passive Exposure: Not on file  Financial Resource Strain: Low Risk (05/03/2023)   Overall Financial Resource Strain (CARDIA)    Difficulty of Paying Living Expenses: Not hard at all  Food Insecurity: No Food Insecurity (10/01/2023)   Received from [facility]   Epic    Within the past 12 months, you worried that your food would run out before you got the money to  buy more.: Never true    Within the past 12 months, the food you bought just didn't last and you didn't have money to get more.: Never true  Transportation Needs: No Transportation Needs (10/01/2023)   Received from [facility] - Transportation    Lack of Transportation (Medical): No    Lack of Transportation (Non-Medical): No  Physical Activity: Insufficiently Active (05/03/2023)   Exercise Vital Sign    Days of Exercise per Week: 3 days    Minutes of Exercise per Session: 30 min  Stress: No Stress Concern Present (05/03/2023)   [employer] - Occupational Stress Questionnaire    Feeling of Stress : Not at all  Social Connections: Moderately Integrated (05/03/2023)   Social Connection and Isolation Panel    Frequency of Communication with Friends and Family: Three times a week    Frequency of Social Gatherings with Friends and Family: More than three times a week    Attends Religious Services: Never    Database Administrator or Organizations: Yes    Attends Banker Meetings: 1 to 4 times per year    Marital Status: Married  Catering Manager Violence: Not At Risk (10/16/2023)   Received from [facility]   Epic    Within the last year, have you been afraid of your partner or ex-partner?: No    Within the last year, have you been humiliated or emotionally abused in other ways by your partner or ex-partner?: No    Within the last year, have you been kicked, hit, slapped, or otherwise physically hurt by your partner or ex-partner?: No    Within the last year, have you been raped or forced to have any kind of sexual activity by your partner or ex-partner?: No  Depression (PHQ2-9): Low Risk (04/06/2023)   Depression (PHQ2-9)    PHQ-2 Score: 0  Alcohol Screen: Low Risk (05/03/2023)   Alcohol Screen    Last Alcohol Screening Score (AUDIT): 2  Housing: Low Risk (05/03/2023)   Housing Stability Vital Sign    Unable to Pay for Housing in the  Last Year: No    Number of Times Moved in the Last Year: 0    Homeless in the Last Year: No  Utilities: Low Risk (10/01/2023)   Received from [facility]   Utilities    Within the past 12 months, have you been unable to get utilities(heat, electricity) when it was really needed?: No  Health Literacy: Adequate Health Literacy (04/06/2023)  B1300 Health Literacy    Frequency of need for help with medical instructions: Never    Family History  Problem Relation Age of Onset   Hypertension Mother    Heart attack Mother    Heart disease Father        CABG, uncles as well   Parkinson's disease Father        dementia potentially from parkinsons   Hypertension Father    Colon cancer Father 80   Colon polyps Sister    Hemochromatosis Brother    Esophageal cancer Neg Hx    Stomach cancer Neg Hx    Rectal cancer Neg Hx     ROS: no fevers or chills, productive cough, hemoptysis, dysphasia, odynophagia, melena, hematochezia, dysuria, hematuria, rash, seizure activity, orthopnea, PND, pedal edema, claudication. Remaining systems are negative.  Physical Exam: Well-developed well-nourished in no acute distress.  Skin is warm and dry.  HEENT is normal.  Neck is supple.  Chest is clear to auscultation with normal expansion.  Cardiovascular exam is regular rate and rhythm.  Abdominal exam nontender or distended. No masses palpated. Extremities show no edema. neuro grossly intact  EKG Interpretation Date/Time:  Tuesday February 23 2024 08:49:57 EST Ventricular Rate:  77 PR Interval:  200 QRS Duration:  142 QT Interval:  392 QTC Calculation: 443 R Axis:   -12  Text Interpretation: Normal sinus rhythm Right bundle branch block Confirmed by Pietro Rogue ([id number]) on 02/23/2024 8:52:21 AM    A/P  1 CAD s/p CABG-no CP; continue ASA and statin.  2 Hyperlipidemia-continue statin.  Laboratories January 8 showed LDL 51.  3 Dilated RCIA-schedule fu ultrasound 9/26.   Rogue Pietro,  MD    "

## 2024-02-18 LAB — LIPID PANEL
Chol/HDL Ratio: 2.5 ratio (ref 0.0–5.0)
Cholesterol, Total: 112 mg/dL (ref 100–199)
HDL: 45 mg/dL
LDL Chol Calc (NIH): 51 mg/dL (ref 0–99)
Triglycerides: 82 mg/dL (ref 0–149)
VLDL Cholesterol Cal: 16 mg/dL (ref 5–40)

## 2024-02-18 LAB — COMPREHENSIVE METABOLIC PANEL WITH GFR
ALT: 21 IU/L (ref 0–44)
AST: 24 IU/L (ref 0–40)
Albumin: 4.3 g/dL (ref 3.8–4.8)
Alkaline Phosphatase: 78 IU/L (ref 47–123)
BUN/Creatinine Ratio: 18 (ref 10–24)
BUN: 19 mg/dL (ref 8–27)
Bilirubin Total: 0.7 mg/dL (ref 0.0–1.2)
CO2: 23 mmol/L (ref 20–29)
Calcium: 8.9 mg/dL (ref 8.6–10.2)
Chloride: 103 mmol/L (ref 96–106)
Creatinine, Ser: 1.07 mg/dL (ref 0.76–1.27)
Globulin, Total: 2.8 g/dL (ref 1.5–4.5)
Glucose: 122 mg/dL — ABNORMAL HIGH (ref 70–99)
Potassium: 4.3 mmol/L (ref 3.5–5.2)
Sodium: 140 mmol/L (ref 134–144)
Total Protein: 7.1 g/dL (ref 6.0–8.5)
eGFR: 73 mL/min/1.73

## 2024-02-19 ENCOUNTER — Ambulatory Visit: Payer: Self-pay | Admitting: Cardiology

## 2024-02-23 ENCOUNTER — Encounter: Payer: Self-pay | Admitting: Cardiology

## 2024-02-23 ENCOUNTER — Ambulatory Visit: Attending: Cardiology | Admitting: Cardiology

## 2024-02-23 VITALS — BP 124/78 | HR 77 | Ht 70.0 in | Wt 168.0 lb

## 2024-02-23 DIAGNOSIS — I2510 Atherosclerotic heart disease of native coronary artery without angina pectoris: Secondary | ICD-10-CM | POA: Diagnosis not present

## 2024-02-23 DIAGNOSIS — E7800 Pure hypercholesterolemia, unspecified: Secondary | ICD-10-CM | POA: Insufficient documentation

## 2024-02-23 DIAGNOSIS — I714 Abdominal aortic aneurysm, without rupture, unspecified: Secondary | ICD-10-CM | POA: Insufficient documentation

## 2024-02-23 NOTE — Patient Instructions (Addendum)
 Medication Instructions:  Continue same medications *If you need a refill on your cardiac medications before your next appointment, please call your pharmacy*  Lab Work: None ordered  Testing/Procedures: Abdominal doppler schedule in 10/2024  Follow-Up: At [facility], you and your health needs are our priority.  As part of our continuing mission to provide you with exceptional heart care, our providers are all part of one team.  This team includes your primary Cardiologist (physician) and Advanced Practice Providers or APPs (Physician Assistants and Nurse Practitioners) who all work together to provide you with the care you need, when you need it.  Your next appointment:  1 year    Call in Oct to schedule Jan appointment     Provider:  Unice   We recommend signing up for the patient portal called MyChart.  Sign up information is provided on this After Visit Summary.  MyChart is used to connect with patients for Virtual Visits (Telemedicine).  Patients are able to view lab/test results, encounter notes, upcoming appointments, etc.  Non-urgent messages can be sent to your provider as well.   To learn more about what you can do with MyChart, go to [email or web address].

## 2024-10-28 ENCOUNTER — Ambulatory Visit (HOSPITAL_COMMUNITY)
